# Patient Record
Sex: Female | Born: 1995 | Race: Black or African American | Hispanic: No | Marital: Single | Smoking: Current every day smoker
Health system: Southern US, Community
[De-identification: ages and names within clinical notes are randomized; demographics above are authoritative.]

## PROBLEM LIST (undated history)

## (undated) ENCOUNTER — Inpatient Hospital Stay (HOSPITAL_COMMUNITY): Payer: Self-pay

## (undated) ENCOUNTER — Emergency Department (HOSPITAL_COMMUNITY): Admission: EM | Payer: Medicaid Other

## (undated) DIAGNOSIS — Z202 Contact with and (suspected) exposure to infections with a predominantly sexual mode of transmission: Secondary | ICD-10-CM

## (undated) DIAGNOSIS — F32A Depression, unspecified: Secondary | ICD-10-CM

## (undated) DIAGNOSIS — Z789 Other specified health status: Secondary | ICD-10-CM

## (undated) DIAGNOSIS — G4730 Sleep apnea, unspecified: Secondary | ICD-10-CM

## (undated) DIAGNOSIS — F419 Anxiety disorder, unspecified: Secondary | ICD-10-CM

## (undated) HISTORY — DX: Anxiety disorder, unspecified: F41.9

## (undated) HISTORY — DX: Depression, unspecified: F32.A

## (undated) HISTORY — DX: Sleep apnea, unspecified: G47.30

## (undated) HISTORY — PX: NO PAST SURGERIES: SHX2092

---
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
## 2015-06-24 ENCOUNTER — Emergency Department (HOSPITAL_COMMUNITY): Payer: No Typology Code available for payment source

## 2015-06-24 ENCOUNTER — Emergency Department (HOSPITAL_COMMUNITY)
Admission: EM | Admit: 2015-06-24 | Discharge: 2015-06-24 | Disposition: A | Discharge status: Home / Self Care | Payer: No Typology Code available for payment source | Attending: Emergency Medicine | Admitting: Emergency Medicine

## 2015-06-24 ENCOUNTER — Encounter (HOSPITAL_COMMUNITY): Payer: Self-pay | Admitting: Emergency Medicine

## 2015-06-24 DIAGNOSIS — Y998 Other external cause status: Secondary | ICD-10-CM | POA: Diagnosis not present

## 2015-06-24 DIAGNOSIS — S161XXA Strain of muscle, fascia and tendon at neck level, initial encounter: Secondary | ICD-10-CM

## 2015-06-24 DIAGNOSIS — S29002A Unspecified injury of muscle and tendon of back wall of thorax, initial encounter: Secondary | ICD-10-CM | POA: Diagnosis not present

## 2015-06-24 DIAGNOSIS — S199XXA Unspecified injury of neck, initial encounter: Secondary | ICD-10-CM | POA: Diagnosis present

## 2015-06-24 DIAGNOSIS — Z79899 Other long term (current) drug therapy: Secondary | ICD-10-CM | POA: Insufficient documentation

## 2015-06-24 DIAGNOSIS — Y9389 Activity, other specified: Secondary | ICD-10-CM | POA: Insufficient documentation

## 2015-06-24 DIAGNOSIS — Y92410 Unspecified street and highway as the place of occurrence of the external cause: Secondary | ICD-10-CM | POA: Diagnosis not present

## 2015-06-24 MED ORDER — IBUPROFEN 800 MG PO TABS: 800.0000 mg | ORAL_TABLET | Freq: Three times a day (TID) | ORAL | Status: DC | PRN

## 2015-06-24 MED ORDER — TRAMADOL HCL 50 MG PO TABS: 50.0000 mg | ORAL_TABLET | Freq: Four times a day (QID) | ORAL | Status: DC | PRN
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
## 2015-06-24 NOTE — Discharge Instructions (Signed)
Return here as needed. Follow up with a PCP.

## 2015-07-24 ENCOUNTER — Inpatient Hospital Stay (HOSPITAL_COMMUNITY)
Admission: AD | Admit: 2015-07-24 | Discharge: 2015-07-25 | Disposition: A | Discharge status: Home / Self Care | Payer: Medicaid Other | Source: Ambulatory Visit | Attending: Obstetrics & Gynecology | Admitting: Obstetrics & Gynecology

## 2015-07-24 DIAGNOSIS — B9689 Other specified bacterial agents as the cause of diseases classified elsewhere: Secondary | ICD-10-CM | POA: Insufficient documentation

## 2015-07-24 DIAGNOSIS — N760 Acute vaginitis: Secondary | ICD-10-CM

## 2015-07-24 DIAGNOSIS — O23591 Infection of other part of genital tract in pregnancy, first trimester: Secondary | ICD-10-CM | POA: Insufficient documentation

## 2015-07-24 DIAGNOSIS — Z3A01 Less than 8 weeks gestation of pregnancy: Secondary | ICD-10-CM | POA: Insufficient documentation

## 2015-07-24 DIAGNOSIS — O26899 Other specified pregnancy related conditions, unspecified trimester: Secondary | ICD-10-CM

## 2015-07-24 DIAGNOSIS — R109 Unspecified abdominal pain: Secondary | ICD-10-CM

## 2015-07-25 ENCOUNTER — Encounter (HOSPITAL_COMMUNITY): Payer: Self-pay

## 2015-07-25 ENCOUNTER — Inpatient Hospital Stay (HOSPITAL_COMMUNITY): Payer: Medicaid Other

## 2015-07-25 DIAGNOSIS — B9689 Other specified bacterial agents as the cause of diseases classified elsewhere: Secondary | ICD-10-CM | POA: Diagnosis not present

## 2015-07-25 DIAGNOSIS — N760 Acute vaginitis: Secondary | ICD-10-CM

## 2015-07-25 DIAGNOSIS — R109 Unspecified abdominal pain: Secondary | ICD-10-CM | POA: Diagnosis present

## 2015-07-25 DIAGNOSIS — A499 Bacterial infection, unspecified: Secondary | ICD-10-CM | POA: Diagnosis not present

## 2015-07-25 DIAGNOSIS — O9989 Other specified diseases and conditions complicating pregnancy, childbirth and the puerperium: Secondary | ICD-10-CM

## 2015-07-25 DIAGNOSIS — O23591 Infection of other part of genital tract in pregnancy, first trimester: Secondary | ICD-10-CM

## 2015-07-25 DIAGNOSIS — Z3A01 Less than 8 weeks gestation of pregnancy: Secondary | ICD-10-CM | POA: Diagnosis not present

## 2015-07-25 LAB — WET PREP, GENITAL
Sperm: NONE SEEN
TRICH WET PREP: NONE SEEN
YEAST WET PREP: NONE SEEN

## 2015-07-25 LAB — CBC
HCT: 36 % (ref 36.0–46.0)
Hemoglobin: 12.4 g/dL (ref 12.0–15.0)
MCH: 30.5 pg (ref 26.0–34.0)
MCHC: 34.4 g/dL (ref 30.0–36.0)
MCV: 88.7 fL (ref 78.0–100.0)
PLATELETS: 275 10*3/uL (ref 150–400)
RBC: 4.06 MIL/uL (ref 3.87–5.11)
RDW: 13.2 % (ref 11.5–15.5)
WBC: 9.2 10*3/uL (ref 4.0–10.5)

## 2015-07-25 LAB — URINALYSIS, ROUTINE W REFLEX MICROSCOPIC
Bilirubin Urine: NEGATIVE
Glucose, UA: NEGATIVE mg/dL
HGB URINE DIPSTICK: NEGATIVE
Ketones, ur: NEGATIVE mg/dL
Leukocytes, UA: NEGATIVE
NITRITE: NEGATIVE
Protein, ur: NEGATIVE mg/dL
SPECIFIC GRAVITY, URINE: 1.025 (ref 1.005–1.030)
pH: 6 (ref 5.0–8.0)

## 2015-07-25 LAB — GC/CHLAMYDIA PROBE AMP (~~LOC~~) NOT AT ARMC
CHLAMYDIA, DNA PROBE: NEGATIVE
Neisseria Gonorrhea: NEGATIVE

## 2015-07-25 LAB — HIV ANTIBODY (ROUTINE TESTING W REFLEX): HIV SCREEN 4TH GENERATION: NONREACTIVE

## 2015-07-25 LAB — HCG, QUANTITATIVE, PREGNANCY: HCG, BETA CHAIN, QUANT, S: 15629 m[IU]/mL — AB (ref <5)

## 2015-07-25 LAB — RPR: RPR Ser Ql: NONREACTIVE

## 2015-07-25 LAB — POCT PREGNANCY, URINE: PREG TEST UR: POSITIVE — AB

## 2015-07-25 MED ORDER — METRONIDAZOLE 500 MG PO TABS: 500.0000 mg | ORAL_TABLET | Freq: Two times a day (BID) | ORAL | Status: DC

## 2015-07-25 NOTE — MAU Note (Signed)
Pt c/o lower abdominal cramping that started 2 weeks ago-on and off. Rates 5/10. Has not taken anything for the pain. Had 2 +upt at home. Denies vag bleeding or discharge. LMP: 06/11/2015.

## 2015-07-25 NOTE — MAU Provider Note (Signed)
History   [id number]   Chief Complaint  Patient presents with  . Possible Pregnancy  . Abdominal Cramping    HPI Kristine Erickson is a 20 y.o. female  G2P1001 at [redacted]w[redacted]d by LMP here with report of abdominal cramping that started two wedks ago.  Pain is intermittent in nature and not unilateral.  Pain is described as crampy.  Denies vaginal bleeding or abnormal vaginal discharge.    Patient's last menstrual period was 06/11/2015.  OB History  Gravida Para Term Preterm AB SAB TAB Ectopic Multiple Living  0 1    # Outcome Date GA Lbr Len/2nd Weight Sex Delivery Anes PTL Lv  2 Current           1 Term 04/16/14 [redacted]w[redacted]d 17:04 / 00:34 2.79 kg (6 lb 2.4 oz) F Vag-Spont EPI  Y      Past Medical History  Diagnosis Date  . Medical history non-contributory     No family history on file.  Social History   Social History  . Marital Status: Single    Spouse Name: N/A  . Number of Children: N/A  . Years of Education: N/A   Social History Main Topics  . Smoking status: Never Smoker   . Smokeless tobacco: Never Used  . Alcohol Use: No  . Drug Use: No  . Sexual Activity: Not Currently   Other Topics Concern  . None   Social History Narrative    No Known Allergies  No current facility-administered medications on file prior to encounter.   Current Outpatient Prescriptions on File Prior to Encounter  Medication Sig Dispense Refill  . ibuprofen (ADVIL,MOTRIN) 800 MG tablet Take 1 tablet (800 mg total) by mouth every 8 (eight) hours as needed. 21 tablet 0  . Prenatal Vit-Fe Fumarate-FA (PRENATAL MULTIVITAMIN) TABS tablet Take 1 tablet by mouth daily.    . traMADol (ULTRAM) 50 MG tablet Take 1 tablet (50 mg total) by mouth every 6 (six) hours as needed. 15 tablet 0     Review of Systems  Constitutional: Negative for fever and chills.  Gastrointestinal: Negative for nausea and vomiting.  Genitourinary: Positive for frequency and pelvic pain. Negative for dysuria,  vaginal bleeding and vaginal discharge.  All other systems reviewed and are negative.    Physical Exam   Filed Vitals:   07/25/15 0006  BP: 134/69  Pulse: 93  Temp: 98.3 F (36.8 C)  TempSrc: Oral  Resp: 18  Height:  (1.6 m)  Weight: 81.194 kg (179 lb)  SpO2: 100%    Physical Exam  Constitutional: She is oriented to person, place, and time. She appears well-developed and well-nourished. No distress.  HENT:  Head: Normocephalic.  Neck: Normal range of motion. Neck supple.  Cardiovascular: Normal rate, regular rhythm and normal heart sounds.   Respiratory: Effort normal and breath sounds normal. No respiratory distress.  GI: Soft. There is no tenderness.  Genitourinary: Right adnexum displays no mass, no tenderness and no fullness. Left adnexum displays no mass, no tenderness and no fullness. No bleeding in the vagina.  Musculoskeletal: Normal range of motion.  Neurological: She is alert and oriented to person, place, and time.  Skin: Skin is warm and dry.    MAU Course  Procedures  MDM Results for orders placed or performed during the hospital encounter of 07/24/15 (from the past 24 hour(s))  Urinalysis, Routine w reflex microscopic (not at [facility])     Status: None  Collection Time: 07/25/15 12:08 AM  Result Value Ref Range   Color, Urine YELLOW YELLOW   APPearance CLEAR CLEAR   Specific Gravity, Urine 1.025 1.005 - 1.030   pH 6.0 5.0 - 8.0   Glucose, UA NEGATIVE NEGATIVE mg/dL   Hgb urine dipstick NEGATIVE NEGATIVE   Bilirubin Urine NEGATIVE NEGATIVE   Ketones, ur NEGATIVE NEGATIVE mg/dL   Protein, ur NEGATIVE NEGATIVE mg/dL   Nitrite NEGATIVE NEGATIVE   Leukocytes, UA NEGATIVE NEGATIVE  Pregnancy, urine POC     Status: Abnormal   Collection Time: 07/25/15 12:31 AM  Result Value Ref Range   Preg Test, Ur POSITIVE (A) NEGATIVE  hCG, quantitative, pregnancy     Status: Abnormal   Collection Time: 07/25/15  1:06 AM  Result Value Ref Range   hCG, Beta  Chain, Quant, S 15629 (H) <5 mIU/mL  CBC     Status: None   Collection Time: 07/25/15  1:06 AM  Result Value Ref Range   WBC 9.2 4.0 - 10.5 K/uL   RBC 4.06 3.87 - 5.11 MIL/uL   Hemoglobin 12.4 12.0 - 15.0 g/dL   HCT 16.136.0 09.636.0 - 04.546.0 %   MCV 88.7 78.0 - 100.0 fL   MCH 30.5 26.0 - 34.0 pg   MCHC 34.4 30.0 - 36.0 g/dL   RDW 40.913.2 81.111.5 - 91.415.5 %   Platelets 275 150 - 400 K/uL  Wet prep, genital     Status: Abnormal   Collection Time: 07/25/15  1:15 AM  Result Value Ref Range   Yeast Wet Prep HPF POC NONE SEEN NONE SEEN   Trich, Wet Prep NONE SEEN NONE SEEN   Clue Cells Wet Prep HPF POC PRESENT (A) NONE SEEN   WBC, Wet Prep HPF POC MANY (A) NONE SEEN   Sperm NONE SEEN    Ultrasound: FINDINGS: Intrauterine gestational sac: Single  Yolk sac: Yes  Embryo: Yes  Cardiac Activity: Yes  Heart Rate: 106 bpm  MSD: mm w d  CRL: 2.3 mm 5 w 5 d US EDC: 03/21/2016  Subchorionic hemorrhage: None visualized.  Maternal uterus/adnexae: Normal ovaries. No abnormal pelvic fluid collections.  IMPRESSION: Single living intrauterine gestation measuring 5 weeks 5 days by crown-rump length  Assessment and Plan  20 y.o. G2P1001 at 468w2d IUP  Bacterial Vaginosis  Plan: Discharge home Begin prenatal care RX Flagyl Early Pregnancy precautions  Marlis EdelsonWalidah N Karim, CNM 07/25/2015 2:53 AM

## 2015-07-25 NOTE — Discharge Instructions (Signed)

## 2015-09-29 ENCOUNTER — Ambulatory Visit (INDEPENDENT_AMBULATORY_CARE_PROVIDER_SITE_OTHER): Payer: Medicaid Other | Admitting: General Practice

## 2015-09-29 VITALS — BP 130/88 | HR 92 | Ht 63.0 in | Wt 172.0 lb

## 2015-09-29 DIAGNOSIS — O360120 Maternal care for anti-D [Rh] antibodies, second trimester, not applicable or unspecified: Secondary | ICD-10-CM | POA: Diagnosis present

## 2015-09-29 DIAGNOSIS — O360190 Maternal care for anti-D [Rh] antibodies, unspecified trimester, not applicable or unspecified: Secondary | ICD-10-CM

## 2015-09-29 MED ORDER — RHO D IMMUNE GLOBULIN 1500 UNIT/2ML IJ SOSY
300.0000 ug | PREFILLED_SYRINGE | Freq: Once | INTRAMUSCULAR | Status: AC
  Administered 2015-09-29: 300 ug via INTRAMUSCULAR

## 2015-09-29 NOTE — Progress Notes (Signed)
Patient here for rhogam today. Patient had TAB on 7/10 in [place]. Patient was told after she left she needed a rhogam shot asap and was told it would cost her another $100. Patient here today for injection.

## 2016-03-16 ENCOUNTER — Encounter (HOSPITAL_COMMUNITY): Payer: Self-pay | Admitting: Emergency Medicine

## 2016-03-16 ENCOUNTER — Emergency Department (HOSPITAL_COMMUNITY)
Admission: EM | Admit: 2016-03-16 | Discharge: 2016-03-16 | Disposition: A | Discharge status: Home / Self Care | Payer: Medicaid Other | Attending: Emergency Medicine | Admitting: Emergency Medicine

## 2016-03-16 DIAGNOSIS — Z79899 Other long term (current) drug therapy: Secondary | ICD-10-CM | POA: Insufficient documentation

## 2016-03-16 DIAGNOSIS — H1033 Unspecified acute conjunctivitis, bilateral: Secondary | ICD-10-CM

## 2016-03-16 MED ORDER — ERYTHROMYCIN 5 MG/GM OP OINT: TOPICAL_OINTMENT | OPHTHALMIC | 0 refills | Status: DC

## 2016-03-16 NOTE — Discharge Instructions (Signed)
Use ointment 4 times daily on both eyes for the next week Wash hands frequently since this is very contagious

## 2016-03-16 NOTE — ED Triage Notes (Signed)
Pt complaint of right eye redness, itching, and burning onset 2 days ago. Pt continues to reports "spreading to other eye too."

## 2016-03-16 NOTE — ED Provider Notes (Signed)
[facility]-EMERGENCY DEPT Provider Note   CSN: [id number] Arrival date & time: 03/16/16  1128   By signing my name below, I, Kristine Erickson, attest that this documentation has been prepared under the direction and in the presence of Terance HartKelly Gekas, PA-C Electronically Signed: Valentino SaxonBianca Erickson, ED Scribe. 03/16/16. 12:04 PM.  History   Chief Complaint Chief Complaint  Patient presents with  . Conjunctivitis   The history is provided by the patient. No language interpreter was used.   HPI Comments: Kristine Erickson is a 20 y.o. female who presents to the Emergency Department complaining of moderate, constant, right eye redness onset two days ago. She denies recent trauma or injury. Pt reports associated itching and burning sensation and notes it is spreading to her left eye. She notes her "right eyeball is tight and painful". Pt states her vision got blurry last night followed by photophobia. She reports swelling around her eyes when she woke up this morning. No alleviating factors noted. Denies HA, fever, cough, rhinorrhea, congestion, CP. Pt states she denies prescption eye wear or contacts. She also denies recent sick contact.    Past Medical History:  Diagnosis Date  . Medical history non-contributory     Patient Active Problem List   Diagnosis Date Noted  . Normal labor 04/16/2014  . Status post normal vaginal delivery 04/16/2014  . [redacted] weeks gestation of pregnancy   . Vaginal bleeding in pregnancy     Past Surgical History:  Procedure Laterality Date  . NO PAST SURGERIES      OB History    Gravida Para Term Preterm AB Living   2 1 1     1    SAB TAB Ectopic Multiple Live Births         0 1       Home Medications    Prior to Admission medications   Medication Sig Start Date End Date Taking? Authorizing Provider  metroNIDAZOLE (FLAGYL) 500 MG tablet Take 1 tablet (500 mg total) by mouth 2 (two) times daily. 07/25/15   Marlis EdelsonWalidah N Karim, CNM  Prenatal Vit-Fe Fumarate-FA  (PRENATAL MULTIVITAMIN) TABS tablet Take 1 tablet by mouth daily.    Historical Provider, MD    Family History No family history on file.  Social History Social History  Substance Use Topics  . Smoking status: Never Smoker  . Smokeless tobacco: Never Used  . Alcohol use No     Allergies   Patient has no known allergies.   Review of Systems Review of Systems  Constitutional: Negative for fever.  HENT: Negative for congestion and rhinorrhea.   Eyes: Positive for photophobia, pain, redness, itching and visual disturbance.  Respiratory: Negative for cough.   Cardiovascular: Negative for chest pain.  Neurological: Negative for headaches.     Physical Exam Updated Vital Signs BP 124/77 (BP Location: Right Arm)   Pulse 98   Temp 98.6 F (37 C) (Oral)   Resp 16   LMP 02/27/2016   SpO2 100%   Breastfeeding? Unknown   Physical Exam  Constitutional: She is oriented to person, place, and time. She appears well-developed and well-nourished. No distress.  HENT:  Head: Normocephalic and atraumatic.  Eyes: Lids are normal. Pupils are equal, round, and reactive to light. Lids are everted and swept, no foreign bodies found. Right eye exhibits no chemosis, no discharge, no exudate and no hordeolum. No foreign body present in the right eye. Left eye exhibits no chemosis, no discharge, no exudate and no hordeolum. No foreign  body present in the left eye. Right conjunctiva is injected. Right conjunctiva has no hemorrhage. Left conjunctiva is not injected. Left conjunctiva has no hemorrhage. No scleral icterus. Right eye exhibits normal extraocular motion. Left eye exhibits normal extraocular motion.  Fundoscopic exam:      The right eye shows no AV nicking, no exudate, no hemorrhage and no papilledema.       The left eye shows no AV nicking, no exudate, no hemorrhage and no papilledema.  Neck: Normal range of motion.  Cardiovascular: Normal rate.   Pulmonary/Chest: Effort normal. No  respiratory distress.  Abdominal: She exhibits no distension.  Neurological: She is alert and oriented to person, place, and time.  Skin: Skin is warm and dry.  Psychiatric: She has a normal mood and affect. Her behavior is normal.  Nursing note and vitals reviewed.    ED Treatments / Results   DIAGNOSTIC STUDIES: Oxygen Saturation is 100% on RA, normal by my interpretation.    COORDINATION OF CARE: 12:01 PM Discussed treatment plan with pt at bedside which includes prescribed ointment and f/u with ophthamologist if symptoms worsen and pt agreed to plan.   Labs (all labs ordered are listed, but only abnormal results are displayed) Labs Reviewed - No data to display  EKG  EKG Interpretation None       Radiology No results found.  Procedures Procedures (including critical care time)  Medications Ordered in ED Medications - No data to display   Initial Impression / Assessment and Plan / ED Course  I have reviewed the triage vital signs and the nursing notes.  Pertinent labs & imaging results that were available during my care of the patient were reviewed by me and considered in my medical decision making (see chart for details).  Clinical Course    Patient presentation consistent with conjunctivitis. Visual acuity is 20/30 in L and 20/25 in R. She is non-toxic appearing and NAD. Presentation not concerning for iritis, or corneal abrasions.  Pt discharged with erythromycin ointment.  Personal hygiene and frequent handwashing discussed.  Patient advised to follow up with ophthalmologist if symptoms persist or worsen. Return precautions discussed.  Patient verbalizes understanding and is agreeable with discharge.  Final Clinical Impressions(s) / ED Diagnoses   Final diagnoses:  Acute conjunctivitis of both eyes, unspecified acute conjunctivitis type    New Prescriptions Discharge Medication List as of 03/16/2016 12:06 PM    START taking these medications   Details    erythromycin ophthalmic ointment Place a 1/2 inch ribbon of ointment into the lower eyelid four times daily for the next week, Print        I personally performed the services described in this documentation, which was scribed in my presence. The recorded information has been reviewed and is accurate.     Bethel BornKelly Marie Gekas, PA-C 03/16/16 1247    Tilden FossaElizabeth Rees, MD 03/20/16 Windell [id number]

## 2016-03-20 ENCOUNTER — Ambulatory Visit (HOSPITAL_COMMUNITY)
Admission: EM | Admit: 2016-03-20 | Discharge: 2016-03-20 | Disposition: A | Discharge status: Home / Self Care | Payer: Medicaid Other | Attending: Family Medicine | Admitting: Family Medicine

## 2016-03-20 ENCOUNTER — Encounter (HOSPITAL_COMMUNITY): Payer: Self-pay | Admitting: Emergency Medicine

## 2016-03-20 DIAGNOSIS — J00 Acute nasopharyngitis [common cold]: Secondary | ICD-10-CM

## 2016-03-20 DIAGNOSIS — B308 Other viral conjunctivitis: Secondary | ICD-10-CM

## 2016-03-20 MED ORDER — SULFACETAMIDE SODIUM 10 % OP SOLN: 1.0000 [drp] | OPHTHALMIC | 0 refills | Status: DC

## 2016-03-20 MED ORDER — IPRATROPIUM BROMIDE 0.06 % NA SOLN: 2.0000 | Freq: Four times a day (QID) | NASAL | 12 refills | Status: DC

## 2016-03-20 NOTE — ED Provider Notes (Signed)
CSN: [id number]     Arrival date & time 03/20/16  1859 History   First MD Initiated Contact with Patient 03/20/16 1937     Chief Complaint  Patient presents with  . Eye Problem   (Consider location/radiation/quality/duration/timing/severity/associated sxs/prior Treatment) Patient c/o right eye discharge and pink eye.  She was seen 12/29 and rx'd erythromycin opthalmic gel and she c/o it makes her vision blurry.  She states she has sinus congestion and cold sx's.   The history is provided by the patient.  Eye Problem  Location:  Right eye Quality:  Tearing Severity:  Moderate Onset quality:  Sudden Duration:  5 days Timing:  Constant Progression:  Improving Chronicity:  New Relieved by:  Nothing Worsened by:  Nothing Associated symptoms: discharge, itching and redness     Past Medical History:  Diagnosis Date  . Medical history non-contributory    Past Surgical History:  Procedure Laterality Date  . NO PAST SURGERIES     History reviewed. No pertinent family history. Social History  Substance Use Topics  . Smoking status: Never Smoker  . Smokeless tobacco: Never Used  . Alcohol use No   OB History    Gravida Para Term Preterm AB Living   2 1 1     1    SAB TAB Ectopic Multiple Live Births         0 1     Review of Systems  Constitutional: Negative.   HENT: Positive for rhinorrhea.   Eyes: Positive for discharge, redness and itching.  Respiratory: Negative.   Cardiovascular: Negative.   Gastrointestinal: Negative.   Endocrine: Negative.   Genitourinary: Negative.   Allergic/Immunologic: Negative.   Neurological: Negative.   Hematological: Negative.   Psychiatric/Behavioral: Negative.     Allergies  Patient has no known allergies.  Home Medications   Prior to Admission medications   Medication Sig Start Date End Date Taking? Authorizing Provider  erythromycin ophthalmic ointment Place a 1/2 inch ribbon of ointment into the lower eyelid four times daily  for the next week 03/16/16   Bethel BornKelly Marie Gekas, PA-C  ipratropium (ATROVENT) 0.06 % nasal spray Place 2 sprays into both nostrils 4 (four) times daily. 03/20/16   Deatra CanterWilliam J Zainab Crumrine, FNP  sulfacetamide (BLEPH-10) 10 % ophthalmic solution Place 1-2 drops into the right eye every 4 (four) hours. 03/20/16   Deatra CanterWilliam J Natasia Sanko, FNP   Meds Ordered and Administered this Visit  Medications - No data to display  BP 127/71 (BP Location: Left Arm)   Pulse 90   Temp 98.5 F (36.9 C) (Oral)   Resp 16   LMP 02/27/2016   SpO2 100%  No data found.   Physical Exam  Constitutional: She appears well-developed and well-nourished.  HENT:  Head: Normocephalic.  Right Ear: External ear normal.  Left Ear: External ear normal.  Mouth/Throat: Oropharynx is clear and moist.  Eyes: EOM are normal. Pupils are equal, round, and reactive to light. Right eye exhibits discharge.  Neck:  Right conjunctiva with erythema and edema   Cardiovascular: Normal rate, regular rhythm and normal heart sounds.   Pulmonary/Chest: Effort normal and breath sounds normal.  Nursing note and vitals reviewed.   Urgent Care Course   Clinical Course     Procedures (including critical care time)  Labs Review Labs Reviewed - No data to display  Imaging Review No results found.   Visual Acuity Review  Right Eye Distance:   Left Eye Distance:   Bilateral Distance:  Right Eye Near:   Left Eye Near:    Bilateral Near:         MDM   1. Chronic viral conjunctivitis of right eye   2. Acute nasopharyngitis    Bleph - 10 2 gtt's OD q 4 hours Ipratropium 0.06% 2 sprays per nostril qid #9ml      Deatra Canter, FNP 03/20/16 1950

## 2016-03-20 NOTE — ED Triage Notes (Signed)
[facility] a [facility] of right red eye itching and burning for 5 days.

## 2016-04-07 ENCOUNTER — Encounter (HOSPITAL_COMMUNITY): Payer: Self-pay | Admitting: *Deleted

## 2016-04-07 ENCOUNTER — Inpatient Hospital Stay (HOSPITAL_COMMUNITY)
Admission: AD | Admit: 2016-04-07 | Discharge: 2016-04-07 | Disposition: A | Discharge status: Home / Self Care | Payer: Medicaid Other | Source: Ambulatory Visit | Attending: Obstetrics and Gynecology | Admitting: Obstetrics and Gynecology

## 2016-04-07 DIAGNOSIS — Z3202 Encounter for pregnancy test, result negative: Secondary | ICD-10-CM | POA: Insufficient documentation

## 2016-04-07 DIAGNOSIS — R109 Unspecified abdominal pain: Secondary | ICD-10-CM | POA: Insufficient documentation

## 2016-04-07 DIAGNOSIS — F17210 Nicotine dependence, cigarettes, uncomplicated: Secondary | ICD-10-CM | POA: Insufficient documentation

## 2016-04-07 LAB — URINALYSIS, ROUTINE W REFLEX MICROSCOPIC
BILIRUBIN URINE: NEGATIVE
Glucose, UA: NEGATIVE mg/dL
HGB URINE DIPSTICK: NEGATIVE
Ketones, ur: NEGATIVE mg/dL
NITRITE: NEGATIVE
PH: 7 (ref 5.0–8.0)
Protein, ur: NEGATIVE mg/dL
SPECIFIC GRAVITY, URINE: 1.006 (ref 1.005–1.030)

## 2016-04-07 LAB — HCG, QUANTITATIVE, PREGNANCY: hCG, Beta Chain, Quant, S: <1 m[IU]/mL (ref <5)

## 2016-04-07 LAB — POCT PREGNANCY, URINE: Preg Test, Ur: NEGATIVE

## 2016-04-07 NOTE — MAU Provider Note (Signed)
History     CSN: [id number]  Arrival date and time: 04/07/16 1954   First Provider Initiated Contact with Patient 04/07/16 2132      Chief Complaint  Patient presents with  . Abdominal Pain   HPI   Ms.Kristine Erickson is a 21 y.o. female G2P1001 here in MAU for a pregnancy test. She had 2 positive tests at home. She has had occasional abdominal pain. She denies pain at this time.   OB History    Gravida Para Term Preterm AB Living   2 1 1     1    SAB TAB Ectopic Multiple Live Births         0 1      Past Medical History:  Diagnosis Date  . Medical history non-contributory     Past Surgical History:  Procedure Laterality Date  . NO PAST SURGERIES      History reviewed. No pertinent family history.  Social History  Substance Use Topics  . Smoking status: Current Every Day Smoker    Types: Cigarettes  . Smokeless tobacco: Never Used  . Alcohol use No    Allergies: No Known Allergies  Prescriptions Prior to Admission  Medication Sig Dispense Refill Last Dose  . erythromycin ophthalmic ointment Place a 1/2 inch ribbon of ointment into the lower eyelid four times daily for the next week 1 g 0   . ipratropium (ATROVENT) 0.06 % nasal spray Place 2 sprays into both nostrils 4 (four) times daily. 15 mL 12   . sulfacetamide (BLEPH-10) 10 % ophthalmic solution Place 1-2 drops into the right eye every 4 (four) hours. 10 mL 0    Recent Results (from the past 2160 hour(s))  Urinalysis, Routine w reflex microscopic     Status: Abnormal   Collection Time: 04/07/16  9:14 PM  Result Value Ref Range   Color, Urine STRAW (A) YELLOW   APPearance CLEAR CLEAR   Specific Gravity, Urine 1.006 1.005 - 1.030   pH 7.0 5.0 - 8.0   Glucose, UA NEGATIVE NEGATIVE mg/dL   Hgb urine dipstick NEGATIVE NEGATIVE   Bilirubin Urine NEGATIVE NEGATIVE   Ketones, ur NEGATIVE NEGATIVE mg/dL   Protein, ur NEGATIVE NEGATIVE mg/dL   Nitrite NEGATIVE NEGATIVE   Leukocytes, UA SMALL (A) NEGATIVE    RBC / HPF 0-5 0 - 5 RBC/hpf   WBC, UA 0-5 0 - 5 WBC/hpf   Bacteria, UA RARE (A) NONE SEEN   Squamous Epithelial / LPF 0-5 (A) NONE SEEN  Pregnancy, urine POC     Status: None   Collection Time: 04/07/16  9:22 PM  Result Value Ref Range   Preg Test, Ur NEGATIVE NEGATIVE    Comment:        THE SENSITIVITY OF THIS METHODOLOGY IS >24 mIU/mL   hCG, quantitative, pregnancy     Status: None   Collection Time: 04/07/16  9:34 PM  Result Value Ref Range   hCG, Beta Chain, Quant, S <1 <5 mIU/mL    Comment:          GEST. AGE      CONC.  (mIU/mL)   <=1 WEEK        5 - 50     2 WEEKS       50 - 500     3 WEEKS       100 - 10,000     4 WEEKS     1,000 - 30,000     5 WEEKS  3,500 - 115,000   6-8 WEEKS     12,000 - 270,000    12 WEEKS     15,000 - 220,000        FEMALE AND NON-PREGNANT FEMALE:     LESS THAN 5 mIU/mL REPEATED TO VERIFY    Review of Systems  Gastrointestinal: Negative for abdominal pain.  Genitourinary: Negative for vaginal bleeding.   Physical Exam   Blood pressure 131/81, pulse 81, temperature 98.4 F (36.9 C), temperature source Oral, resp. rate 16, last menstrual period 02/27/2016, SpO2 100 %, unknown if currently breastfeeding.  Physical Exam  Constitutional: She is oriented to person, place, and time. She appears well-developed and well-nourished. No distress.  HENT:  Head: Normocephalic.  Respiratory: Effort normal.  Musculoskeletal: Normal range of motion.  Neurological: She is alert and oriented to person, place, and time.  Skin: Skin is warm. She is not diaphoretic.  Psychiatric: Her behavior is normal.   MAU Course  Procedures  None  MDM  Urine pregnancy test negative Beta hcg level- negative.   Assessment and Plan   A:  1. Encounter for pregnancy test with result negative     P:  Discharge home in stable condition Return to MAU for emergencies only   Duane LopeJennifer I Frimet Durfee, NP 04/08/2016 1:42 AM

## 2016-04-07 NOTE — Progress Notes (Signed)
Pt came in for pregnancy test and abdominal pain with her 2913yr old. Pt told to let someone pick up child due to flu restriction. Pt states she does not have anyone. Pt said she will come back later. Pt left in good condition.

## 2016-04-07 NOTE — Progress Notes (Signed)
Pt back after dropping off daughter. HCG and UA ordered. Results pending

## 2016-04-07 NOTE — MAU Note (Signed)
Pt has small child with her. No children under 21yo allowed due to flu restrictions. Pt does not have anyone to come get her. Pt is going to take child to someone and return in about 

## 2016-04-07 NOTE — Discharge Instructions (Signed)
Human Chorionic Gonadotropin Test Human chorionic gonadotropin (hCG) is a hormone produced during pregnancy by the cells that form the placenta. The placenta is the organ that grows inside your womb (uterus) to nourish a developing baby. When you are pregnant, hCG starts to appear in your blood about 11 days after conception. It continues to go up for the first 8-11 weeks of pregnancy. Your hCG level can be measured with several different types of tests. You may have:  A urine test.  hCG is eliminated from your body by your kidneys, so a urine test is one way to check for this hormone.  A urine test only shows whether there is hCG in your urine. It does not measure how much.  You may have a urine test to find out whether you are pregnant.  A home pregnancy test detects whether there is hCG in your urine.  A qualitative blood test.  Like the urine test, this blood test only shows whether there is hCG in your blood. It does not measure how much.  You may have this type of blood test to find out whether you are pregnant.  A quantitative blood test.  This type of blood test measures the amount of hCG in your blood.  You may have this type of test to diagnose an abnormal pregnancy or determine whether you are at risk of, or have had, a failed pregnancy (miscarriage). How do I prepare for this test? For the urine test:  Limit your fluid intake before the urine test as directed by your health care provider.  Collect the sample the first time you urinate in the morning.  Let your health care provider know if you have blood in your urine. This may interfere with the test result. Some medicines may interfere with the urine and blood tests. Let your health care provider know about all the medicines you are taking. No additional preparation is required for the blood test. What do the results mean? It is your responsibility to obtain your test results. Ask the lab or department performing the  test when and how you will get your results. Talk to your health care provider if you have any questions about your test results. The results of the hCG urine test and the qualitative hCG blood test are either positive or negative. The results of the quantitative hCG blood test are reported as a number. hCG is measured in international units per liter (IU/L). Meaning of Negative Test Results  A negative result on a urine or qualitative blood test could mean that you are not pregnant. It could also mean the test was done too early to detect hCG. If you still have other signs of pregnancy, the test should be repeated. Meaning of Positive Test Results  A positive result on the urine or qualitative blood tests means you are most likely pregnant. Your health care provider may confirm your pregnancy with an imaging study of the inside of your uterus at 5-6 weeks (ultrasound). Range of Normal Values  Ranges for normal values for the quantitative hCG blood test may vary among different labs and hospitals. You should always check with your health care provider after having lab work or other tests done to discuss whether your values are considered within normal limits.  Less than 5 IU/L means it is most likely you are not pregnant.  Greater than 25 IU/L means it is most likely you are pregnant. Meaning of Results Outside Normal Value Ranges  If your  hCG level on the quantitative test is not what would be expected, you may have the test again. It may also be important for your health care provider to know whether your hCG level goes up or down over time. Common causes of results outside the normal range include: °· Being pregnant with twins (hCG level is higher than expected). °· Having an ectopic pregnancy (hCG rises more slowly than expected). °· Miscarriage (hCG level falls). °· Abnormal growths in the womb (hCG level is higher than expected). °Talk with your health care provider to discuss your results,  treatment options, and if necessary, the need for more tests. Talk with your health care provider if you have any questions about your results. °This information is not intended to replace advice given to you by your health care provider. Make sure you discuss any questions you have with your health care provider. °Document Released: 04/06/2004 Document Revised: 11/09/2015 Document Reviewed: 06/09/2013 °Elsevier Interactive Patient Education © 2017 [employer] ° °

## 2016-06-08 ENCOUNTER — Encounter (HOSPITAL_COMMUNITY): Payer: Self-pay | Admitting: *Deleted

## 2016-06-08 ENCOUNTER — Emergency Department (HOSPITAL_COMMUNITY)
Admission: EM | Admit: 2016-06-08 | Discharge: 2016-06-08 | Disposition: A | Discharge status: Home / Self Care | Payer: Self-pay | Attending: Emergency Medicine | Admitting: Emergency Medicine

## 2016-06-08 ENCOUNTER — Emergency Department (HOSPITAL_COMMUNITY)
Admission: EM | Admit: 2016-06-08 | Discharge: 2016-06-08 | Disposition: A | Discharge status: Home / Self Care | Payer: Medicaid Other | Attending: Emergency Medicine | Admitting: Emergency Medicine

## 2016-06-08 ENCOUNTER — Encounter (HOSPITAL_COMMUNITY): Payer: Self-pay | Admitting: Emergency Medicine

## 2016-06-08 DIAGNOSIS — B9689 Other specified bacterial agents as the cause of diseases classified elsewhere: Secondary | ICD-10-CM | POA: Insufficient documentation

## 2016-06-08 DIAGNOSIS — F17210 Nicotine dependence, cigarettes, uncomplicated: Secondary | ICD-10-CM | POA: Insufficient documentation

## 2016-06-08 DIAGNOSIS — N898 Other specified noninflammatory disorders of vagina: Secondary | ICD-10-CM | POA: Insufficient documentation

## 2016-06-08 DIAGNOSIS — N72 Inflammatory disease of cervix uteri: Secondary | ICD-10-CM

## 2016-06-08 DIAGNOSIS — N760 Acute vaginitis: Secondary | ICD-10-CM | POA: Insufficient documentation

## 2016-06-08 DIAGNOSIS — R102 Pelvic and perineal pain: Secondary | ICD-10-CM | POA: Insufficient documentation

## 2016-06-08 LAB — URINALYSIS, ROUTINE W REFLEX MICROSCOPIC
BILIRUBIN URINE: NEGATIVE
Bacteria, UA: NONE SEEN
GLUCOSE, UA: NEGATIVE mg/dL
HGB URINE DIPSTICK: NEGATIVE
Ketones, ur: NEGATIVE mg/dL
NITRITE: NEGATIVE
PH: 7 (ref 5.0–8.0)
Protein, ur: NEGATIVE mg/dL
SPECIFIC GRAVITY, URINE: 1.015 (ref 1.005–1.030)
Squamous Epithelial / LPF: NONE SEEN

## 2016-06-08 LAB — WET PREP, GENITAL
SPERM: NONE SEEN
Trich, Wet Prep: NONE SEEN
Yeast Wet Prep HPF POC: NONE SEEN

## 2016-06-08 LAB — POC URINE PREG, ED: PREG TEST UR: NEGATIVE

## 2016-06-08 MED ORDER — AZITHROMYCIN 250 MG PO TABS
1000.0000 mg | ORAL_TABLET | Freq: Once | ORAL | Status: AC
  Administered 2016-06-08: 1000 mg via ORAL
  Filled 2016-06-08: qty 4

## 2016-06-08 MED ORDER — METRONIDAZOLE 500 MG PO TABS: 500.0000 mg | ORAL_TABLET | Freq: Two times a day (BID) | ORAL | 0 refills | Status: DC

## 2016-06-08 MED ORDER — CEFTRIAXONE SODIUM 250 MG IJ SOLR
250.0000 mg | Freq: Once | INTRAMUSCULAR | Status: AC
  Administered 2016-06-08: 250 mg via INTRAMUSCULAR
  Filled 2016-06-08: qty 250

## 2016-06-08 NOTE — ED Triage Notes (Signed)
Pt states she has been having abd pain for three days. Denies urinary symptoms. Pt endorses vaginal discharge

## 2016-06-08 NOTE — ED Provider Notes (Signed)
[facility]ENCY DEPT Provider Note   CSN: [id number] Arrival date & time: 06/08/16  1228     History   Chief Complaint Chief Complaint  Patient presents with  . Vaginal Discharge  . Abdominal Pain    HPI Kristine Erickson is a 21 y.o. female.  The history is provided by the patient.  Vaginal Discharge   This is a new problem. Episode onset: 3 days. The problem occurs constantly. The problem has not changed since onset.The discharge occurs after urination. The discharge was yellow and watery. She is not pregnant. Associated symptoms include abdominal pain (suprapubic cramping) and nausea. Pertinent negatives include no fever, no diarrhea, no vomiting, no dysuria, no genital itching and no genital lesions. She has tried nothing for the symptoms. Her past medical history is significant for STD (chlamydia 2 yrs ago).   Admits to unprotected sex with one partner. Last encounter was approx 2 weeks ago.  LMP: 05/16/16  Past Medical History:  Diagnosis Date  . Medical history non-contributory     Patient Active Problem List   Diagnosis Date Noted  . Normal labor 04/16/2014  . Status post normal vaginal delivery 04/16/2014  . [redacted] weeks gestation of pregnancy   . Vaginal bleeding in pregnancy     Past Surgical History:  Procedure Laterality Date  . NO PAST SURGERIES      OB History    Gravida Para Term Preterm AB Living   2 1 1     1    SAB TAB Ectopic Multiple Live Births         0 1       Home Medications    Prior to Admission medications   Medication Sig Start Date End Date Taking? Authorizing Provider  erythromycin ophthalmic ointment Place a 1/2 inch ribbon of ointment into the lower eyelid four times daily for the next week 03/16/16   Bethel Born, PA-C  ipratropium (ATROVENT) 0.06 % nasal spray Place 2 sprays into both nostrils 4 (four) times daily. 03/20/16   Deatra Canter, FNP  sulfacetamide (BLEPH-10) 10 % ophthalmic solution Place 1-2 drops into the  right eye every 4 (four) hours. 03/20/16   Deatra Canter, FNP    Family History History reviewed. No pertinent family history.  Social History Social History  Substance Use Topics  . Smoking status: Current Every Day Smoker    Types: Cigarettes  . Smokeless tobacco: Never Used  . Alcohol use No     Allergies   Patient has no known allergies.   Review of Systems Review of Systems  Constitutional: Negative for fever.  Gastrointestinal: Positive for abdominal pain (suprapubic cramping) and nausea. Negative for diarrhea and vomiting.  Genitourinary: Positive for vaginal discharge. Negative for dysuria.  Ten systems are reviewed and are negative for acute change except as noted in the HPI    Physical Exam Updated Vital Signs BP 109/63 (BP Location: Right Arm)   Pulse 86   Temp 99.1 F (37.3 C) (Oral)   Resp 17   LMP 05/16/2016   SpO2 100%   Physical Exam  Constitutional: She is oriented to person, place, and time. She appears well-developed and well-nourished. No distress.  HENT:  Head: Normocephalic and atraumatic.  Nose: Nose normal.  Eyes: Conjunctivae and EOM are normal. Pupils are equal, round, and reactive to light. Right eye exhibits no discharge. Left eye exhibits no discharge. No scleral icterus.  Neck: Normal range of motion. Neck supple.  Cardiovascular: Normal rate and  regular rhythm.  Exam reveals no gallop and no friction rub.   No murmur heard. Pulmonary/Chest: Effort normal and breath sounds normal. No stridor. No respiratory distress. She has no rales.  Abdominal: Soft. She exhibits no distension. There is no hepatosplenomegaly. There is tenderness in the suprapubic area. There is no rigidity, no rebound, no guarding and no tenderness at McBurney's point.  Genitourinary:  Genitourinary Comments:    Musculoskeletal: She exhibits no edema or tenderness.  Neurological: She is alert and oriented to person, place, and time.  Skin: Skin is warm and dry.  No rash noted. She is not diaphoretic. No erythema.  Psychiatric: She has a normal mood and affect.  Vitals reviewed.    ED Treatments / Results  Labs (all labs ordered are listed, but only abnormal results are displayed) Labs Reviewed  URINALYSIS, ROUTINE W REFLEX MICROSCOPIC - Abnormal; Notable for the following:       Result Value   Leukocytes, UA SMALL (*)    All other components within normal limits  WET PREP, GENITAL  POC URINE PREG, ED  GC/CHLAMYDIA PROBE AMP ([place]) NOT AT [facility]    EKG  EKG Interpretation None       Radiology No results found.  Procedures Procedures (including critical care time)  Medications Ordered in ED Medications - No data to display   Initial Impression / Assessment and Plan / ED Course  I have reviewed the triage vital signs and the nursing notes.  Pertinent labs & imaging results that were available during my care of the patient were reviewed by me and considered in my medical decision making (see chart for details).     Unable to perform the pelvic exam at this time as patient had to leave to pick up her daughter. Reported that she will return following that in order to continue her care. UA and urine pregnancy negative. Please refer to subsequent note for further details.  The patient is safe for discharge with strict return precautions.   Final Clinical Impressions(s) / ED Diagnoses   Final diagnoses:  Vaginal discharge  Pelvic pain   Disposition: Discharge  Condition: Good  I have discussed the results, Dx and Tx plan with the patient who expressed understanding and agree(s) with the plan. Discharge instructions discussed at great length. The patient was given strict return precautions who verbalized understanding of the instructions. No further questions at time of discharge.    Discharge Medication List as of 06/08/2016  5:31 PM        Nira ConnPedro Eduardo Cardama, MD 06/08/16 [phone]

## 2016-06-08 NOTE — ED Triage Notes (Signed)
Pt reports lower abd cramping with vaginal discharge. Denies bleeding, denies urinary symptoms.

## 2016-06-08 NOTE — ED Provider Notes (Signed)
[facility] Provider Note   CSN: [id number] Arrival date & time: 06/08/16  1832   By signing my name below, I, Nelwyn Salisbury, attest that this documentation has been prepared under the direction and in the presence of non-physician practitioner, Kerrie Buffalo, NP. Electronically Signed: Nelwyn Salisbury, Scribe. 06/08/2016. 7:39 PM.   History   Chief Complaint Chief Complaint  Patient presents with  . Abdominal Pain   The history is provided by the patient. No language interpreter was used.  Abdominal Pain   This is a new problem. The current episode started more than 2 days ago. The problem occurs constantly. The problem has not changed since onset.The pain is located in the suprapubic region. Associated symptoms include frequency. Pertinent negatives include fever, diarrhea, nausea, vomiting, constipation, dysuria and headaches. The symptoms are aggravated by palpation. Nothing relieves the symptoms.    HPI Comments:  Kristine Erickson is an otherwise healthy 21 y.o. female who presents to the Emergency Department complaining of constant, mild, suprapubic abdominal pain onset 3 days ago. She reports associated vaginal discharge, frequency and chills. Pt describes her vaginal discharge as light yellow, non-odorous, and watery. She denies any new sex partners and is not suspicious of any potential STI infection. Her pain is worsened with palpation. She denies any vaginal bleeding, dysuria or difficulty urinating.   Past Medical History:  Diagnosis Date  . Medical history non-contributory     Patient Active Problem List   Diagnosis Date Noted  . Normal labor 04/16/2014  . Status post normal vaginal delivery 04/16/2014  . [redacted] weeks gestation of pregnancy   . Vaginal bleeding in pregnancy     Past Surgical History:  Procedure Laterality Date  . NO PAST SURGERIES      OB History    Gravida Para Term Preterm AB Living   2 1 1     1    SAB TAB Ectopic Multiple Live Births     0 1       Home Medications    Prior to Admission medications   Medication Sig Start Date End Date Taking? Authorizing Provider  erythromycin ophthalmic ointment Place a 1/2 inch ribbon of ointment into the lower eyelid four times daily for the next week 03/16/16   Bethel Born, PA-C  ipratropium (ATROVENT) 0.06 % nasal spray Place 2 sprays into both nostrils 4 (four) times daily. 03/20/16   Deatra Canter, FNP  metroNIDAZOLE (FLAGYL) 500 MG tablet Take 1 tablet (500 mg total) by mouth 2 (two) times daily. 06/08/16   Hope Orlene Och, NP  sulfacetamide (BLEPH-10) 10 % ophthalmic solution Place 1-2 drops into the right eye every 4 (four) hours. 03/20/16   Deatra Canter, FNP    Family History History reviewed. No pertinent family history.  Social History Social History  Substance Use Topics  . Smoking status: Current Every Day Smoker    Types: Cigarettes  . Smokeless tobacco: Never Used  . Alcohol use No     Allergies   Patient has no known allergies.   Review of Systems Review of Systems  Constitutional: Negative for chills and fever.  Cardiovascular: Negative for chest pain.  Gastrointestinal: Positive for abdominal pain. Negative for constipation, diarrhea, nausea and vomiting.  Genitourinary: Positive for frequency and vaginal discharge. Negative for difficulty urinating, dysuria and vaginal bleeding.  Musculoskeletal: Negative for back pain.  Skin: Negative for rash.  Allergic/Immunologic: Negative for immunocompromised state.  Neurological: Negative for headaches.  Psychiatric/Behavioral: The patient is not  nervous/anxious.      Physical Exam Updated Vital Signs BP 132/66 (BP Location: Left Arm)   Pulse 89   Temp 98.1 F (36.7 C) (Oral)   Resp 18   LMP 05/16/2016   SpO2 99%   Physical Exam  Constitutional: She is oriented to person, place, and time. She appears well-developed and well-nourished. No distress.  HENT:  Head: Normocephalic and atraumatic.    Eyes: Conjunctivae are normal.  Cardiovascular: Normal rate, regular rhythm and normal heart sounds.   Pulmonary/Chest: Effort normal.  Abdominal: Soft. She exhibits no distension. There is tenderness.  Lower abdominal tenderness with palpation  Genitourinary:  Genitourinary Comments: External genitalia without lesions, yellow frothy d/c vaginal vault, cervix inflamed, positive CMT, left adnexal tenderness that is mild. Uterus not enlarged.   Neurological: She is alert and oriented to person, place, and time.  Skin: Skin is warm and dry.  Psychiatric: She has a normal mood and affect.  Nursing note and vitals reviewed.    ED Treatments / Results  DIAGNOSTIC STUDIES:  Oxygen Saturation is 100% on RA, normal by my interpretation.    COORDINATION OF CARE:  8:03 PM Discussed treatment plan with pt at bedside which includes a pelvic exam and pt agreed to plan.  Labs (all labs ordered are listed, but only abnormal results are displayed) Labs Reviewed  HIV ANTIBODY (ROUTINE TESTING)  RPR  GC/CHLAMYDIA PROBE AMP ([place]  Radiology No results found.  Procedures Procedures (including critical care time)  Medications Ordered in ED Medications  cefTRIAXone (ROCEPHIN) injection 250 mg (250 mg Intramuscular Given 06/08/16 2203)  azithromycin (ZITHROMAX) tablet 1,000 mg (1,000 mg Oral Given 06/08/16 2203)     Initial Impression / Assessment and Plan / ED Course  I have reviewed the triage vital signs and the nursing notes.  Pertinent lab results that were available during my care of the patient were reviewed by me and considered in my medical decision making (see chart for details).  Patient is nontoxic, nonseptic appearing, in no apparent distress.  Patient's pain and other symptoms adequately managed in emergency department.  Labs and vitals reviewed.  Patient does not meet the SIRS or Sepsis criteria.  On repeat exam patient does not have a surgical abdomin and  there are no peritoneal signs.  No indication of appendicitis, bowel obstruction, bowel perforation, cholecystitis, diverticulitis, PID or ectopic pregnancy.  Patient discharged home with after treated with Rocephin and Zithromax for Cervicitis and given strict instructions for follow-up with their primary care physician. Rx for Flagyl for BV. I have also discussed reasons to return immediately to the ER.  Patient expresses understanding and agrees with plan.    Final Clinical Impressions(s) / ED Diagnoses   Final diagnoses:  Cervicitis  Bacterial vaginitis    New Prescriptions Discharge Medication List as of 06/08/2016 10:17 PM    START taking these medications   Details  metroNIDAZOLE (FLAGYL) 500 MG tablet Take 1 tablet (500 mg total) by mouth 2 (two) times daily., Starting Fri 06/08/2016, Print      I personally performed the services described in this documentation, which was scribed in my presence. The recorded information has been reviewed and is accurate.     [id number]Hope MorrowM Neese, NP 06/09/16 0131    Jerelyn ScottMartha Linker, MD 06/09/16 [phone]

## 2016-06-08 NOTE — ED Notes (Signed)
Pelvic cart was set up for patient.  Patient then informed staff that she needs to leave and would like her discharge papers.  Waiting for MD to return and will inform him.

## 2016-06-08 NOTE — ED Notes (Signed)
Patient spoke with EDP and decided to leave, but will be back.

## 2016-06-08 NOTE — ED Notes (Signed)
Pt stable, understands discharge instructions, and reasons for return.   

## 2016-06-10 LAB — HIV ANTIBODY (ROUTINE TESTING W REFLEX): HIV SCREEN 4TH GENERATION: NONREACTIVE

## 2016-06-11 LAB — RPR: RPR: NONREACTIVE

## 2016-06-11 LAB — GC/CHLAMYDIA PROBE AMP (~~LOC~~) NOT AT ARMC
CHLAMYDIA, DNA PROBE: NEGATIVE
NEISSERIA GONORRHEA: POSITIVE — AB

## 2016-07-21 ENCOUNTER — Encounter (HOSPITAL_COMMUNITY): Payer: Self-pay

## 2016-07-21 ENCOUNTER — Inpatient Hospital Stay (HOSPITAL_COMMUNITY)
Admission: AD | Admit: 2016-07-21 | Discharge: 2016-07-21 | Disposition: A | Discharge status: Home / Self Care | Payer: Self-pay | Source: Ambulatory Visit | Attending: Obstetrics & Gynecology | Admitting: Obstetrics & Gynecology

## 2016-07-21 ENCOUNTER — Inpatient Hospital Stay (HOSPITAL_COMMUNITY): Payer: Self-pay

## 2016-07-21 DIAGNOSIS — O3481 Maternal care for other abnormalities of pelvic organs, first trimester: Secondary | ICD-10-CM | POA: Insufficient documentation

## 2016-07-21 DIAGNOSIS — O99331 Smoking (tobacco) complicating pregnancy, first trimester: Secondary | ICD-10-CM | POA: Insufficient documentation

## 2016-07-21 DIAGNOSIS — O3680X0 Pregnancy with inconclusive fetal viability, not applicable or unspecified: Secondary | ICD-10-CM | POA: Insufficient documentation

## 2016-07-21 DIAGNOSIS — O26891 Other specified pregnancy related conditions, first trimester: Secondary | ICD-10-CM | POA: Insufficient documentation

## 2016-07-21 DIAGNOSIS — R109 Unspecified abdominal pain: Secondary | ICD-10-CM | POA: Insufficient documentation

## 2016-07-21 DIAGNOSIS — R102 Pelvic and perineal pain: Secondary | ICD-10-CM

## 2016-07-21 DIAGNOSIS — N8311 Corpus luteum cyst of right ovary: Secondary | ICD-10-CM | POA: Insufficient documentation

## 2016-07-21 DIAGNOSIS — Z3201 Encounter for pregnancy test, result positive: Secondary | ICD-10-CM | POA: Insufficient documentation

## 2016-07-21 DIAGNOSIS — F17210 Nicotine dependence, cigarettes, uncomplicated: Secondary | ICD-10-CM | POA: Insufficient documentation

## 2016-07-21 DIAGNOSIS — Z3A01 Less than 8 weeks gestation of pregnancy: Secondary | ICD-10-CM | POA: Insufficient documentation

## 2016-07-21 LAB — URINALYSIS, ROUTINE W REFLEX MICROSCOPIC
Bilirubin Urine: NEGATIVE
GLUCOSE, UA: NEGATIVE mg/dL
HGB URINE DIPSTICK: NEGATIVE
Ketones, ur: NEGATIVE mg/dL
Nitrite: NEGATIVE
Protein, ur: NEGATIVE mg/dL
SPECIFIC GRAVITY, URINE: 1.011 (ref 1.005–1.030)
pH: 6 (ref 5.0–8.0)

## 2016-07-21 LAB — POCT PREGNANCY, URINE: Preg Test, Ur: POSITIVE — AB

## 2016-07-21 LAB — HCG, QUANTITATIVE, PREGNANCY: hCG, Beta Chain, Quant, S: 6394 m[IU]/mL — ABNORMAL HIGH (ref <5)

## 2016-07-21 NOTE — Discharge Instructions (Signed)
Abdominal Pain During Pregnancy Belly (abdominal) pain is common during pregnancy. Most of the time, it is not a serious problem. Other times, it can be a sign that something is wrong with the pregnancy. Always tell your doctor if you have belly pain. Follow these instructions at home: Monitor your belly pain for any changes. The following actions may help you feel better:  Do not have sex (intercourse) or put anything in your vagina until you feel better.  Rest until your pain stops.  Drink clear fluids if you feel sick to your stomach (nauseous). Do not eat solid food until you feel better.  Only take medicine as told by your doctor.  Keep all doctor visits as told. Get help right away if:  You are bleeding, leaking fluid, or pieces of tissue come out of your vagina.  You have more pain or cramping.  You keep throwing up (vomiting).  You have pain when you pee (urinate) or have blood in your pee.  You have a fever.  You do not feel your baby moving as much.  You feel very weak or feel like passing out.  You have trouble breathing, with or without belly pain.  You have a very bad headache and belly pain.  You have fluid leaking from your vagina and belly pain.  You keep having watery poop (diarrhea).  Your belly pain does not go away after resting, or the pain gets worse. This information is not intended to replace advice given to you by your health care provider. Make sure you discuss any questions you have with your health care provider. Document Released: 02/21/2009 Document Revised: 10/12/2015 Document Reviewed: 10/02/2012 Elsevier Interactive Patient Education  2017 [employer].  First Trimester of Pregnancy The first trimester of pregnancy is from week 1 until the end of week 13 (months 1 through 3). A week after a sperm fertilizes an egg, the egg will implant on the wall of the uterus. This embryo will begin to develop into a baby. Genes from you and your partner  will form the baby. The female genes will determine whether the baby will be a boy or a girl. At 6-8 weeks, the eyes and face will be formed, and the heartbeat can be seen on ultrasound. At the end of 12 weeks, all the baby's organs will be formed. Now that you are pregnant, you will want to do everything you can to have a healthy baby. Two of the most important things are to get good prenatal care and to follow your health care provider's instructions. Prenatal care is all the medical care you receive before the baby's birth. This care will help prevent, find, and treat any problems during the pregnancy and childbirth. Body changes during your first trimester Your body goes through many changes during pregnancy. The changes vary from woman to woman.  You may gain or lose a couple of pounds at first.  You may feel sick to your stomach (nauseous) and you may throw up (vomit). If the vomiting is uncontrollable, call your health care provider.  You may tire easily.  You may develop headaches that can be relieved by medicines. All medicines should be approved by your health care provider.  You may urinate more often. Painful urination may mean you have a bladder infection.  You may develop heartburn as a result of your pregnancy.  You may develop constipation because certain hormones are causing the muscles that push stool through your intestines to slow down.  You may develop hemorrhoids or swollen veins (varicose veins).  Your breasts may begin to grow larger and become tender. Your nipples may stick out more, and the tissue that surrounds them (areola) may become darker.  Your gums may bleed and may be sensitive to brushing and flossing.  Dark spots or blotches (chloasma, mask of pregnancy) may develop on your face. This will likely fade after the baby is born.  Your menstrual periods will stop.  You may have a loss of appetite.  You may develop cravings for certain kinds of food.  You  may have changes in your emotions from day to day, such as being excited to be pregnant or being concerned that something may go wrong with the pregnancy and baby.  You may have more vivid and strange dreams.  You may have changes in your hair. These can include thickening of your hair, rapid growth, and changes in texture. Some women also have hair loss during or after pregnancy, or hair that feels dry or thin. Your hair will most likely return to normal after your baby is born. What to expect at prenatal visits During a routine prenatal visit:  You will be weighed to make sure you and the baby are growing normally.  Your blood pressure will be taken.  Your abdomen will be measured to track your baby's growth.  The fetal heartbeat will be listened to between weeks 10 and 14 of your pregnancy.  Test results from any previous visits will be discussed. Your health care provider may ask you:  How you are feeling.  If you are feeling the baby move.  If you have had any abnormal symptoms, such as leaking fluid, bleeding, severe headaches, or abdominal cramping.  If you are using any tobacco products, including cigarettes, chewing tobacco, and electronic cigarettes.  If you have any questions. Other tests that may be performed during your first trimester include:  Blood tests to find your blood type and to check for the presence of any previous infections. The tests will also be used to check for low iron levels (anemia) and protein on red blood cells (Rh antibodies). Depending on your risk factors, or if you previously had diabetes during pregnancy, you may have tests to check for high blood sugar that affects pregnant women (gestational diabetes).  Urine tests to check for infections, diabetes, or protein in the urine.  An ultrasound to confirm the proper growth and development of the baby.  Fetal screens for spinal cord problems (spina bifida) and Down syndrome.  HIV (human  immunodeficiency virus) testing. Routine prenatal testing includes screening for HIV, unless you choose not to have this test.  You may need other tests to make sure you and the baby are doing well. Follow these instructions at home: Medicines   Follow your health care provider's instructions regarding medicine use. Specific medicines may be either safe or unsafe to take during pregnancy.  Take a prenatal vitamin that contains at least 600 micrograms (mcg) of folic acid.  If you develop constipation, try taking a stool softener if your health care provider approves. Eating and drinking   Eat a balanced diet that includes fresh fruits and vegetables, whole grains, good sources of protein such as meat, eggs, or tofu, and low-fat dairy. Your health care provider will help you determine the amount of weight gain that is right for you.  Avoid raw meat and uncooked cheese. These carry germs that can cause birth defects in the baby.  Eating four or five small meals rather than three large meals a day may help relieve nausea and vomiting. If you start to feel nauseous, eating a few soda crackers can be helpful. Drinking liquids between meals, instead of during meals, also seems to help ease nausea and vomiting.  Limit foods that are high in fat and processed sugars, such as fried and sweet foods.  To prevent constipation:  Eat foods that are high in fiber, such as fresh fruits and vegetables, whole grains, and beans.  Drink enough fluid to keep your urine clear or pale yellow. Activity   Exercise only as directed by your health care provider. Most women can continue their usual exercise routine during pregnancy. Try to exercise for 30 minutes at least 5 days a week. Exercising will help you:  Control your weight.  Stay in shape.  Be prepared for labor and delivery.  Experiencing pain or cramping in the lower abdomen or lower back is a good sign that you should stop exercising. Check with  your health care provider before continuing with normal exercises.  Try to avoid standing for long periods of time. Move your legs often if you must stand in one place for a long time.  Avoid heavy lifting.  Wear low-heeled shoes and practice good posture.  You may continue to have sex unless your health care provider tells you not to. Relieving pain and discomfort   Wear a good support bra to relieve breast tenderness.  Take warm sitz baths to soothe any pain or discomfort caused by hemorrhoids. Use hemorrhoid cream if your health care provider approves.  Rest with your legs elevated if you have leg cramps or low back pain.  If you develop varicose veins in your legs, wear support hose. Elevate your feet for 15 minutes, 3-4 times a day. Limit salt in your diet. Prenatal care   Schedule your prenatal visits by the twelfth week of pregnancy. They are usually scheduled monthly at first, then more often in the last 2 months before delivery.  Write down your questions. Take them to your prenatal visits.  Keep all your prenatal visits as told by your health care provider. This is important. Safety   Wear your seat belt at all times when driving.  Make a list of emergency phone numbers, including numbers for family, friends, the hospital, and police and fire departments. General instructions   Ask your health care provider for a referral to a local prenatal education class. Begin classes no later than the beginning of month 6 of your pregnancy.  Ask for help if you have counseling or nutritional needs during pregnancy. Your health care provider can offer advice or refer you to specialists for help with various needs.  Do not use hot tubs, steam rooms, or saunas.  Do not douche or use tampons or scented sanitary pads.  Do not cross your legs for long periods of time.  Avoid cat litter boxes and soil used by cats. These carry germs that can cause birth defects in the baby and  possibly loss of the fetus by miscarriage or stillbirth.  Avoid all smoking, herbs, alcohol, and medicines not prescribed by your health care provider. Chemicals in these products affect the formation and growth of the baby.  Do not use any products that contain nicotine or tobacco, such as cigarettes and e-cigarettes. If you need help quitting, ask your health care provider. You may receive counseling support and other resources to help you quit.  Schedule a dentist appointment. At home, brush your teeth with a soft toothbrush and be gentle when you floss. Contact a health care provider if:  You have dizziness.  You have mild pelvic cramps, pelvic pressure, or nagging pain in the abdominal area.  You have persistent nausea, vomiting, or diarrhea.  You have a bad smelling vaginal discharge.  You have pain when you urinate.  You notice increased swelling in your face, hands, legs, or ankles.  You are exposed to fifth disease or chickenpox.  You are exposed to [place] measles (rubella) and have never had it. Get help right away if:  You have a fever.  You are leaking fluid from your vagina.  You have spotting or bleeding from your vagina.  You have severe abdominal cramping or pain.  You have rapid weight gain or loss.  You vomit blood or material that looks like coffee grounds.  You develop a severe headache.  You have shortness of breath.  You have any kind of trauma, such as from a fall or a car accident. Summary  The first trimester of pregnancy is from week 1 until the end of week 13 (months 1 through 3).  Your body goes through many changes during pregnancy. The changes vary from woman to woman.  You will have routine prenatal visits. During those visits, your health care provider will examine you, discuss any test results you may have, and talk with you about how you are feeling. This information is not intended to replace advice given to you by your health care  provider. Make sure you discuss any questions you have with your health care provider. Document Released: 02/27/2001 Document Revised: 02/15/2016 Document Reviewed: 02/15/2016 Elsevier Interactive Patient Education  2017 [employer]

## 2016-07-21 NOTE — MAU Note (Signed)
Patient had 2 positive UPTs, having cramping which started this morning, LMP 4/2, had spotting about 1 week ago which turned brown, none today.

## 2016-07-21 NOTE — MAU Provider Note (Signed)
History   G2P1001 @ 4.[redacted] wks gestation. Sure of LMP in with mild abd cramping. Denies any vag bleeding.  CSN: [id number]  Arrival date & time 07/21/16  1227   None     Chief Complaint  Patient presents with  . Abdominal Cramping    HPI  Past Medical History:  Diagnosis Date  . Medical history non-contributory     Past Surgical History:  Procedure Laterality Date  . NO PAST SURGERIES      No family history on file.  Social History  Substance Use Topics  . Smoking status: Current Every Day Smoker    Types: Cigarettes  . Smokeless tobacco: Never Used  . Alcohol use No    OB History    Gravida Para Term Preterm AB Living   3 1 1     1    SAB TAB Ectopic Multiple Live Births         0 1      Review of Systems  Constitutional: Negative.   HENT: Negative.   Eyes: Negative.   Respiratory: Negative.   Cardiovascular: Negative.   Gastrointestinal: Positive for abdominal pain.  Endocrine: Negative.   Genitourinary: Negative.   Musculoskeletal: Negative.   Skin: Negative.   Allergic/Immunologic: Negative.   Neurological: Negative.   Hematological: Negative.   Psychiatric/Behavioral: Negative.     Allergies  Patient has no known allergies.  Home Medications    BP 118/75 (BP Location: Right Arm)   Pulse 94   Temp 98.3 F (36.8 C) (Oral)   Resp 16   Ht 5\' 3"  (1.6 m)   Wt 147 lb 1.9 oz (66.7 kg)   LMP 06/18/2016 (Exact Date)   BMI 26.06 kg/m   Physical Exam  Constitutional: She is oriented to person, place, and time. She appears well-developed and well-nourished.  HENT:  Head: Normocephalic.  Eyes: Pupils are equal, round, and reactive to light.  Neck: Normal range of motion.  Cardiovascular: Normal rate, regular rhythm, normal heart sounds and intact distal pulses.   Pulmonary/Chest: Effort normal and breath sounds normal.  Abdominal: Soft. Bowel sounds are normal.  Musculoskeletal: Normal range of motion.  Neurological: She is alert and oriented  to person, place, and time. She has normal reflexes.  Skin: Skin is warm and dry.  Psychiatric: She has a normal mood and affect. Her behavior is normal. Judgment and thought content normal.    MAU Course  Procedures (including critical care time)  Labs Reviewed  POCT PREGNANCY, URINE - Abnormal; Notable for the following:       Result Value   Preg Test, Ur POSITIVE (*)    All other components within normal limits  URINALYSIS, ROUTINE W REFLEX MICROSCOPIC  HCG, QUANTITATIVE, PREGNANCY   No results found.   1. Abdominal pain in pregnancy, first trimester   2. Pregnancy of unknown anatomic location       MDM   G2P1001 @ 4.[redacted] wks gestation. Sure of LMP in with mild abd cramping. Denies any vag bleeding VSS, Heart RRR, LCTAB. abd soft and non tender. Plan: quant 6394. u/s to assess preg location. u/s shows early IUP with gest sac and yolk sac at 5.2 days. POC discussed with Dr. Penne LashLeggett pt to have repeat quant on Monday in clinic. Will d/c home

## 2016-07-21 NOTE — Progress Notes (Addendum)
g3P1 @ 4.[redacted] wksga. Presents to triage due to abdominal pain. Denies LOF or bleeding. Last definite period April 2. Provider at bs assessing pt. Lab and U/S ordered.   U/S notified.   1327: back from U/s.   1400: Quant test + for pregnancy. Provider at bs and informed pt to come to clinic for OB appt.   14: Discharge instructions given with pt understanding. Pt left unit via ambulatory w child.

## 2016-07-23 ENCOUNTER — Ambulatory Visit: Payer: Medicaid Other | Admitting: *Deleted

## 2016-07-23 ENCOUNTER — Encounter: Payer: Self-pay | Admitting: *Deleted

## 2016-07-23 DIAGNOSIS — Z3490 Encounter for supervision of normal pregnancy, unspecified, unspecified trimester: Secondary | ICD-10-CM

## 2016-07-23 LAB — HCG, QUANTITATIVE, PREGNANCY: hCG, Beta Chain, Quant, S: 10621 m[IU]/mL — ABNORMAL HIGH

## 2016-07-23 NOTE — Progress Notes (Signed)
Pt seen in MAU on 07/21/16 due to cramping. Her HCG at that time was 6394. US on same day showed probable intrauterine gestational sac. She was told to return today for STAT HCG level.    Pt reports some mild cramping. She had sex yesterday and noticed some spotting afterwards, it is mostly gone today. Advised patient that when her results come back I will discuss with MD and call her with a plan. Patient is agreeable to this and gave number that she can be reached at. No further questions or concerns at this time. [phone]  [place] [address]. Dr Erin FullingHarraway Smith recommends repeat u/s one week from today. US scheduled for May 15 at 1000. Attempted to call patient, no answer and no voicemail. Mychart message to patient with results and appointment information.

## 2016-07-31 ENCOUNTER — Ambulatory Visit (HOSPITAL_COMMUNITY)
Admission: RE | Admit: 2016-07-31 | Discharge: 2016-07-31 | Disposition: A | Discharge status: Home / Self Care | Payer: Self-pay | Source: Ambulatory Visit | Attending: Obstetrics & Gynecology | Admitting: Obstetrics & Gynecology

## 2016-07-31 ENCOUNTER — Ambulatory Visit: Payer: Self-pay | Admitting: *Deleted

## 2016-07-31 DIAGNOSIS — Z712 Person consulting for explanation of examination or test findings: Secondary | ICD-10-CM

## 2016-07-31 DIAGNOSIS — Z3490 Encounter for supervision of normal pregnancy, unspecified, unspecified trimester: Secondary | ICD-10-CM | POA: Insufficient documentation

## 2016-07-31 NOTE — Progress Notes (Signed)
Per Marylynn Pearsonarrie Hillman RN, she reviewed u/s results of viable pregnancy with patient who had to leave. Her concern was the fetal HR 101. I reviewed this with [place]ia Erickson CNM, who recommended no followup as this is wnl.

## 2016-08-20 IMAGING — US US OB COMP LESS 14 WK
1 series · 15 of 28 positions shown · non-contrast
Comparison: None.

CLINICAL DATA: Abdominal pain

EXAM:
OBSTETRIC <14 WK US AND TRANSVAGINAL OB US
TECHNIQUE: Both transabdominal and transvaginal ultrasound examinations were
performed for complete evaluation of the gestation as well as the
maternal uterus, adnexal regions, and pelvic cul-de-sac.
Transvaginal technique was performed to assess early pregnancy.

[Series 1: us ob comp less 14 wk · 32 acquisitions, 15 frames shown]
[im 1/32]
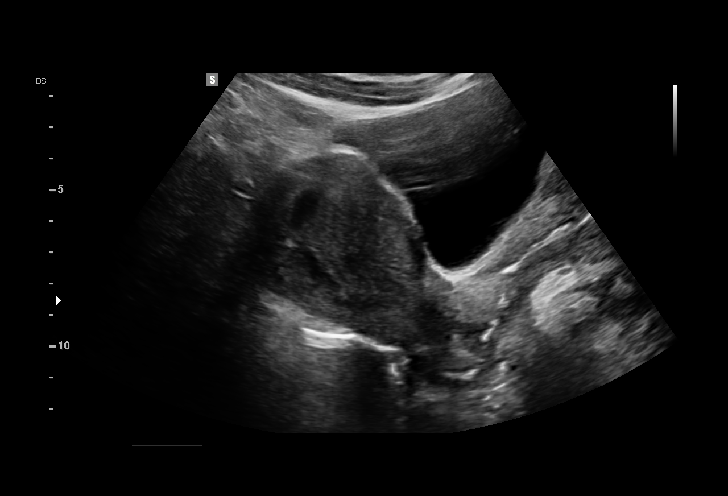
[im 3/32]
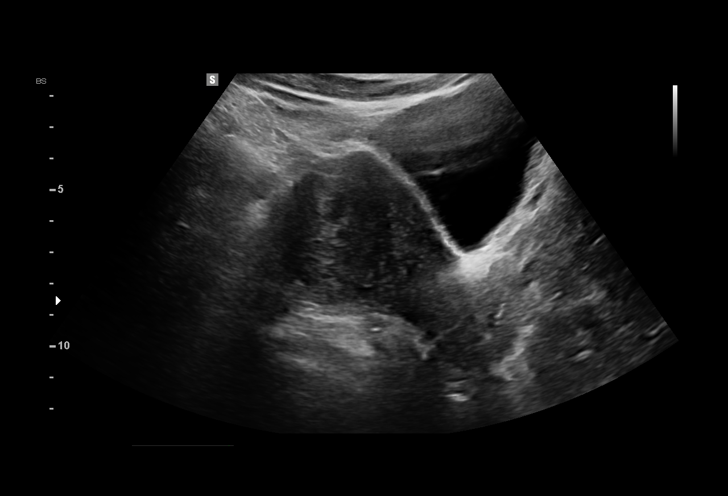
[im 5/32]
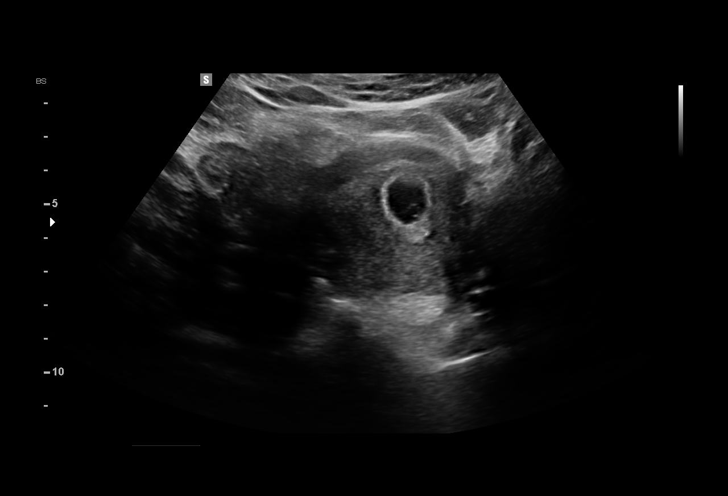
[im 7/32]
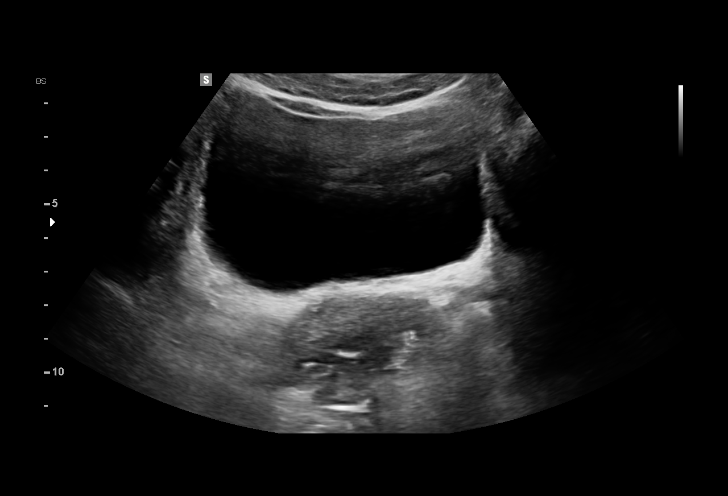
[im 10/32]
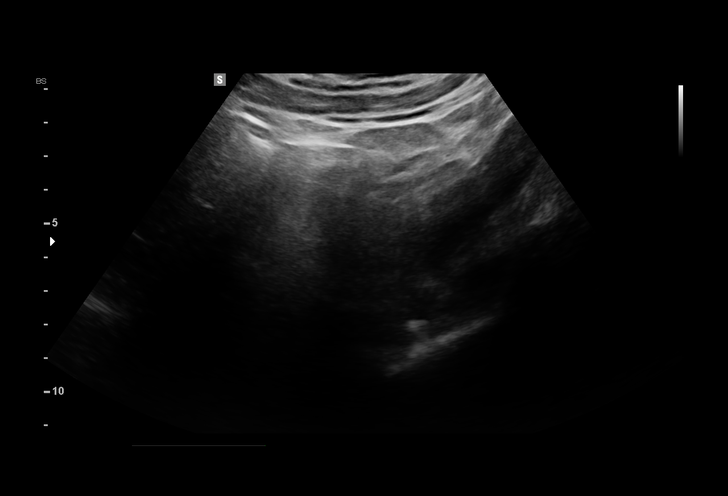
[im 12/32]
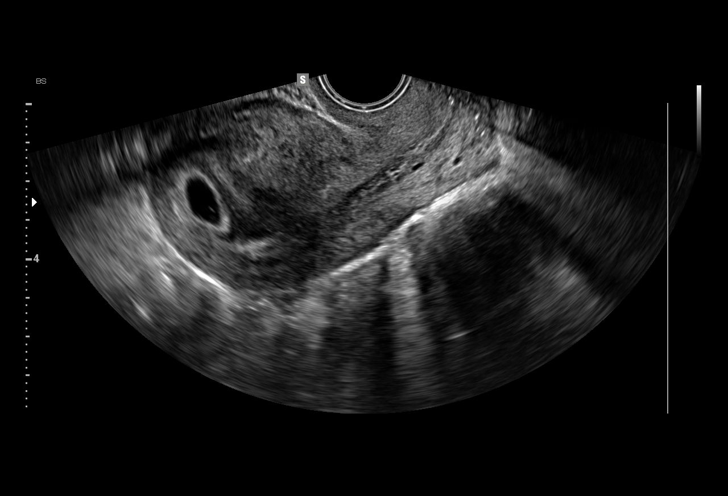
[im 14/32]
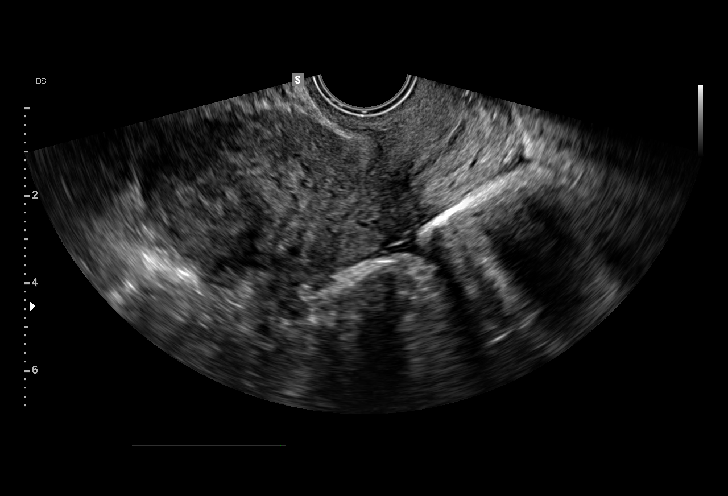
[im 17/32]
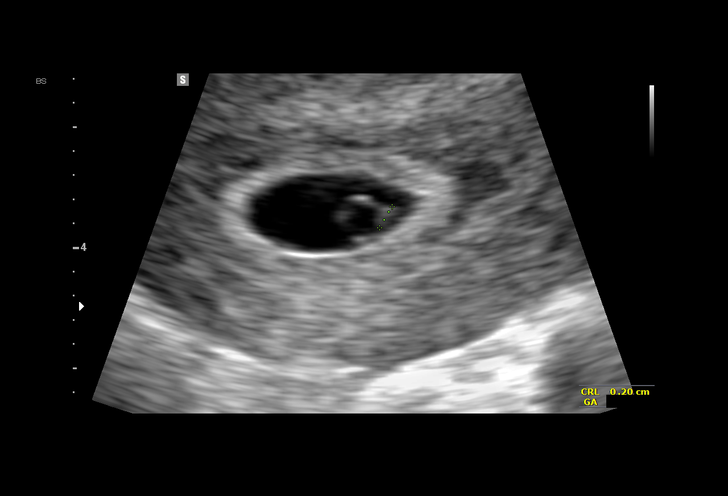
[im 18/32]
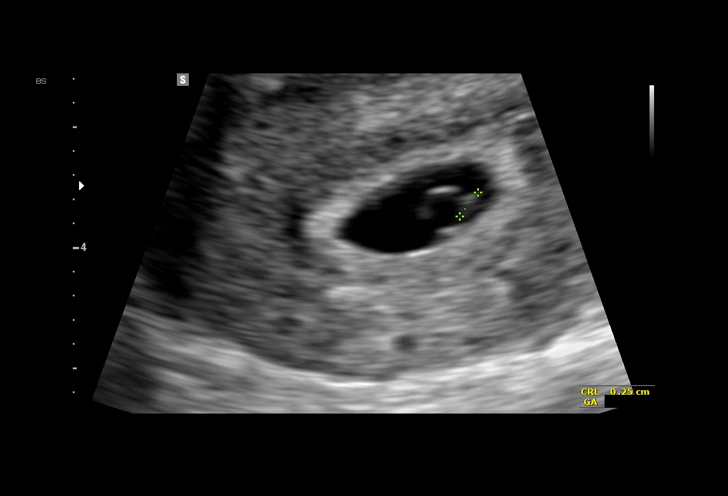
[im 20/32]
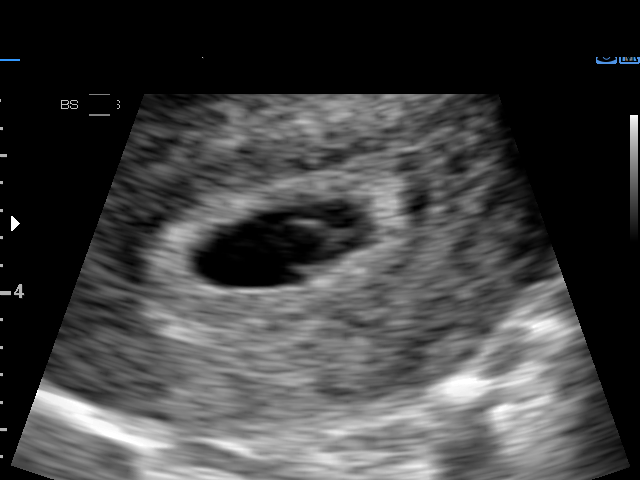
[im 22/32]
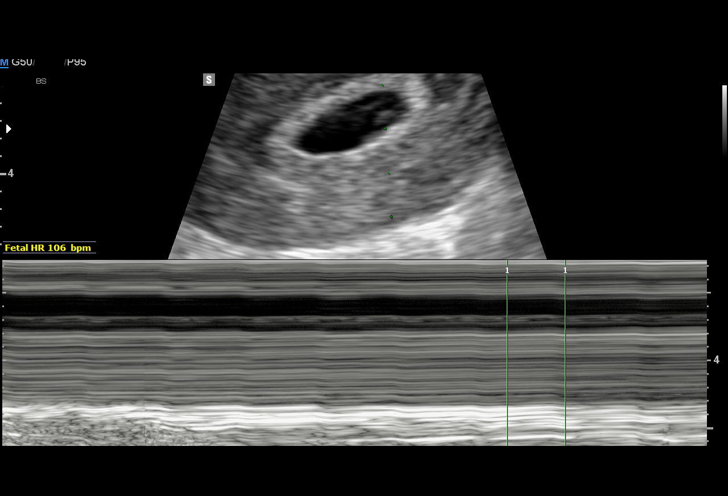
[im 25/32]
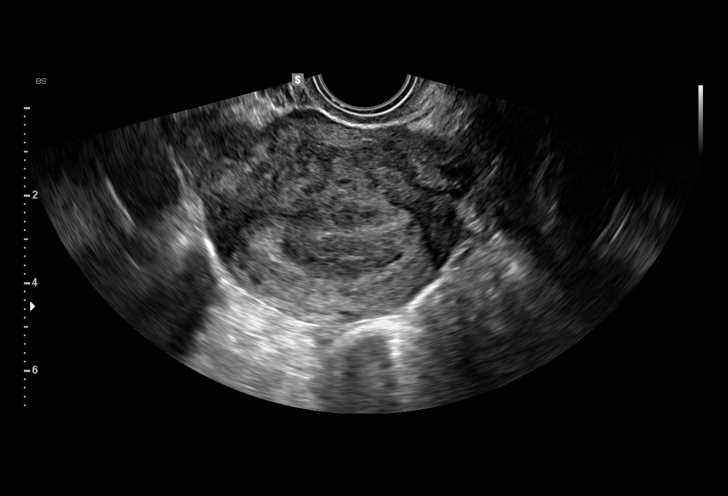
[im 27/32]
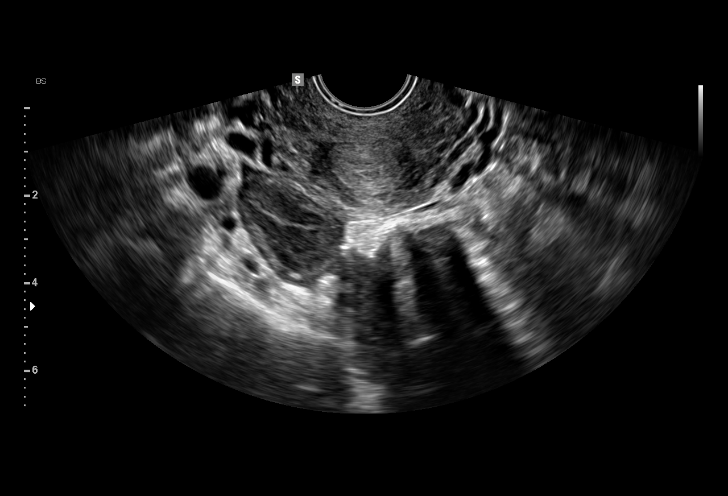
[im 29/32]
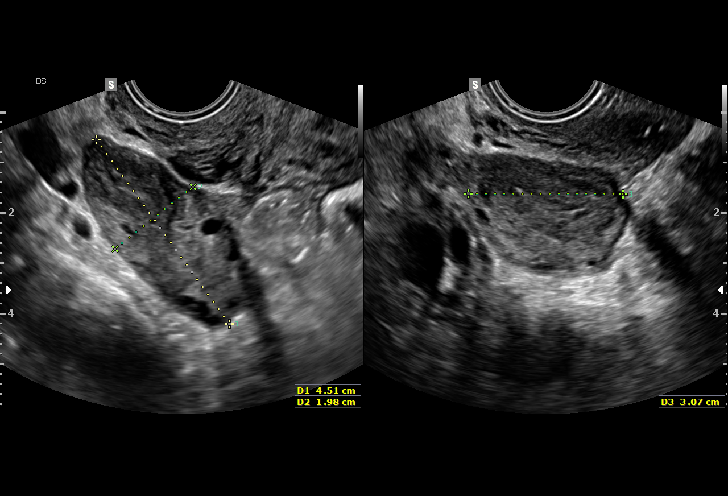
[im 32/32]
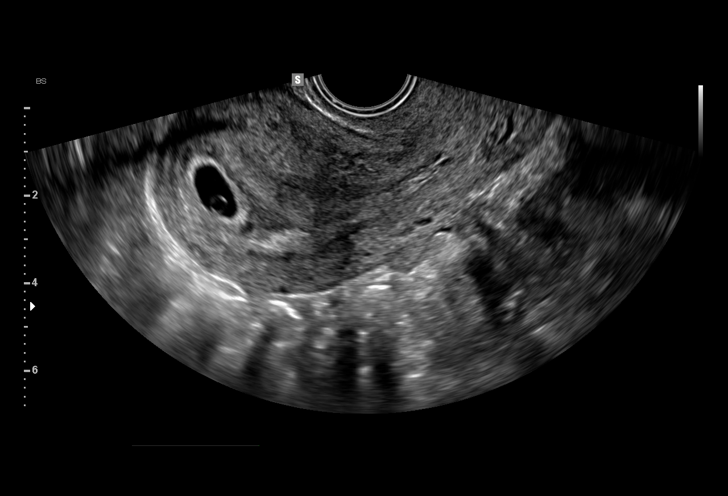

[15 of 28 positions shown; findings below may reference images not displayed]

FINDINGS: Intrauterine gestational sac: Single

Yolk sac:  Yes

Embryo:  Yes

Cardiac Activity: Yes

Heart Rate: 106  bpm

MSD:   mm    w     d

CRL:  2.3  mm   5 w   5 d                  US EDC: 03/21/2016

Subchorionic hemorrhage:  None visualized.

Maternal uterus/adnexae: Normal ovaries. No abnormal pelvic fluid
collections.
IMPRESSION: Single living intrauterine gestation measuring 5 weeks 5 days by
crown-rump length

## 2017-03-07 ENCOUNTER — Encounter (HOSPITAL_COMMUNITY): Payer: Self-pay

## 2017-03-07 ENCOUNTER — Inpatient Hospital Stay (HOSPITAL_COMMUNITY)
Admission: AD | Admit: 2017-03-07 | Discharge: 2017-03-08 | Disposition: A | Discharge status: Home / Self Care | Payer: Medicaid Other | Source: Ambulatory Visit | Attending: Obstetrics | Admitting: Obstetrics

## 2017-03-07 DIAGNOSIS — O471 False labor at or after 37 completed weeks of gestation: Secondary | ICD-10-CM

## 2017-03-07 DIAGNOSIS — O479 False labor, unspecified: Secondary | ICD-10-CM | POA: Insufficient documentation

## 2017-03-07 DIAGNOSIS — Z3A00 Weeks of gestation of pregnancy not specified: Secondary | ICD-10-CM | POA: Insufficient documentation

## 2017-03-07 HISTORY — DX: Contact with and (suspected) exposure to infections with a predominantly sexual mode of transmission: Z20.2

## 2017-03-07 NOTE — MAU Note (Signed)
Pt states that she was checked in the office 2 days ago and was 3cm. Reports u/c''s since 1700 tonight. Denies any vag bleeding or leaking. Fetal ;movement present per pt.

## 2017-03-08 DIAGNOSIS — Z3A00 Weeks of gestation of pregnancy not specified: Secondary | ICD-10-CM | POA: Diagnosis not present

## 2017-03-08 DIAGNOSIS — O479 False labor, unspecified: Secondary | ICD-10-CM | POA: Diagnosis present

## 2017-03-08 NOTE — MAU Note (Signed)
I have communicated with Dr. Chestine Sporelark and reviewed vital signs:  Vitals:   03/07/17 2308 03/08/17 0330  BP: 129/87 122/80  Pulse: 91 95  Resp: 16   Temp: 98.3 F (36.8 C)     Vaginal exam:  Dilation: 4 Effacement (%): 50 Cervical Position: Posterior Station: -3 Presentation: Vertex Exam by:: Camelia Enganielle Simpson RN,   Also reviewed contraction pattern and that non-stress test is reactive.  It has been documented that patient is contracting irregularly with no cervical change over 4 hours not indicating active labor.  Patient denies any other complaints.  Based on this report provider has given order for discharge.  A discharge order and diagnosis entered by a provider.   Labor discharge instructions reviewed with patient.

## 2017-03-08 NOTE — Discharge Instructions (Signed)
Braxton Hicks Contractions °Contractions of the uterus can occur throughout pregnancy, but they are not always a sign that you are in labor. You may have practice contractions called Braxton Hicks contractions. These false labor contractions are sometimes confused with true labor. °What are Braxton Hicks contractions? °Braxton Hicks contractions are tightening movements that occur in the muscles of the uterus before labor. Unlike true labor contractions, these contractions do not result in opening (dilation) and thinning of the cervix. Toward the end of pregnancy (32-34 weeks), Braxton Hicks contractions can happen more often and may become stronger. These contractions are sometimes difficult to tell apart from true labor because they can be very uncomfortable. You should not feel embarrassed if you go to the hospital with false labor. °Sometimes, the only way to tell if you are in true labor is for your health care provider to look for changes in the cervix. The health care provider will do a physical exam and may monitor your contractions. If you are not in true labor, the exam should show that your cervix is not dilating and your water has not broken. °If there are other health problems associated with your pregnancy, it is completely safe for you to be sent home with false labor. You may continue to have Braxton Hicks contractions until you go into true labor. °How to tell the difference between true labor and false labor °True labor °· Contractions last 30-70 seconds. °· Contractions become very regular. °· Discomfort is usually felt in the top of the uterus, and it spreads to the lower abdomen and low back. °· Contractions do not go away with walking. °· Contractions usually become more intense and increase in frequency. °· The cervix dilates and gets thinner. °False labor °· Contractions are usually shorter and not as strong as true labor contractions. °· Contractions are usually irregular. °· Contractions  are often felt in the front of the lower abdomen and in the groin. °· Contractions may go away when you walk around or change positions while lying down. °· Contractions get weaker and are shorter-lasting as time goes on. °· The cervix usually does not dilate or become thin. °Follow these instructions at home: °· Take over-the-counter and prescription medicines only as told by your health care provider. °· Keep up with your usual exercises and follow other instructions from your health care provider. °· Eat and drink lightly if you think you are going into labor. °· If Braxton Hicks contractions are making you uncomfortable: °? Change your position from lying down or resting to walking, or change from walking to resting. °? Sit and rest in a tub of warm water. °? Drink enough fluid to keep your urine pale yellow. Dehydration may cause these contractions. °? Do slow and deep breathing several times an hour. °· Keep all follow-up prenatal visits as told by your health care provider. This is important. °Contact a health care provider if: °· You have a fever. °· You have continuous pain in your abdomen. °Get help right away if: °· Your contractions become stronger, more regular, and closer together. °· You have fluid leaking or gushing from your vagina. °· You pass blood-tinged mucus (bloody show). °· You have bleeding from your vagina. °· You have low back pain that you never had before. °· You feel your baby’s head pushing down and causing pelvic pressure. °· Your baby is not moving inside you as much as it used to. °Summary °· Contractions that occur before labor are called Braxton   Hicks contractions, false labor, or practice contractions. °· Braxton Hicks contractions are usually shorter, weaker, farther apart, and less regular than true labor contractions. True labor contractions usually become progressively stronger and regular and they become more frequent. °· Manage discomfort from Braxton Hicks contractions by  changing position, resting in a warm bath, drinking plenty of water, or practicing deep breathing. °This information is not intended to replace advice given to you by your health care provider. Make sure you discuss any questions you have with your health care provider. °Document Released: 07/19/2016 Document Revised: 07/19/2016 Document Reviewed: 07/19/2016 °Elsevier Interactive Patient Education © 2018 [employer] ° °Fetal Movement Counts °Patient Name: ________________________________________________ Patient Due Date: ____________________ °What is a fetal movement count? °A fetal movement count is the number of times that you feel your baby move during a certain amount of time. This may also be called a fetal kick count. A fetal movement count is recommended for every pregnant woman. You may be asked to start counting fetal movements as early as week 28 of your pregnancy. °Pay attention to when your baby is most active. You may notice your baby's sleep and wake cycles. You may also notice things that make your baby move more. You should do a fetal movement count: °· When your baby is normally most active. °· At the same time each day. ° °A good time to count movements is while you are resting, after having something to eat and drink. °How do I count fetal movements? °1. Find a quiet, comfortable area. Sit, or lie down on your side. °2. Write down the date, the start time and stop time, and the number of movements that you felt between those two times. Take this information with you to your health care visits. °3. For 2 hours, count kicks, flutters, swishes, rolls, and jabs. You should feel at least 10 movements during 2 hours. °4. You may stop counting after you have felt 10 movements. °5. If you do not feel 10 movements in 2 hours, have something to eat and drink. Then, keep resting and counting for 1 hour. If you feel at least 4 movements during that hour, you may stop counting. °Contact a health care  provider if: °· You feel fewer than 4 movements in 2 hours. °· Your baby is not moving like he or she usually does. °Date: ____________ Start time: ____________ Stop time: ____________ Movements: ____________ °Date: ____________ Start time: ____________ Stop time: ____________ Movements: ____________ °Date: ____________ Start time: ____________ Stop time: ____________ Movements: ____________ °Date: ____________ Start time: ____________ Stop time: ____________ Movements: ____________ °Date: ____________ Start time: ____________ Stop time: ____________ Movements: ____________ °Date: ____________ Start time: ____________ Stop time: ____________ Movements: ____________ °Date: ____________ Start time: ____________ Stop time: ____________ Movements: ____________ °Date: ____________ Start time: ____________ Stop time: ____________ Movements: ____________ °Date: ____________ Start time: ____________ Stop time: ____________ Movements: ____________ °This information is not intended to replace advice given to you by your health care provider. Make sure you discuss any questions you have with your health care provider. °Document Released: 04/04/2006 Document Revised: 11/02/2015 Document Reviewed: 04/14/2015 °Elsevier Interactive Patient Education © 2018 [employer] ° °

## 2017-03-12 ENCOUNTER — Inpatient Hospital Stay (HOSPITAL_COMMUNITY)
Admission: AD | Admit: 2017-03-12 | Discharge: 2017-03-12 | Disposition: A | Discharge status: Home / Self Care | Payer: Medicaid Other | Source: Ambulatory Visit | Attending: Obstetrics and Gynecology | Admitting: Obstetrics and Gynecology

## 2017-03-12 ENCOUNTER — Encounter (HOSPITAL_COMMUNITY): Payer: Self-pay | Admitting: *Deleted

## 2017-03-12 DIAGNOSIS — O479 False labor, unspecified: Secondary | ICD-10-CM

## 2017-03-12 NOTE — MAU Note (Signed)
Pt c/o u/c's all day and most recently 3-4 min apart. Denies any vag bleeding or leaking. Reports good fetal movement.

## 2017-03-12 NOTE — Progress Notes (Signed)
Pt here for Term labor check. RN discussed VE and FHR tracing w/ Dr. Tenny Crawoss who discharged pt, but did not enter Dx of false labor. CNM asked to enter Dx. Reviewed FHR tracing. Few questionable mild variables and early decels followed by reactive tracing w/ intermittent MHR tracing at end. FHR obtained before D/C.  Katrinka BlazingSmith, [place], CNM 03/13/2017 12:20 AM

## 2017-03-12 NOTE — Discharge Instructions (Signed)
Braxton Hicks Contractions °Contractions of the uterus can occur throughout pregnancy, but they are not always a sign that you are in labor. You may have practice contractions called Braxton Hicks contractions. These false labor contractions are sometimes confused with true labor. °What are Braxton Hicks contractions? °Braxton Hicks contractions are tightening movements that occur in the muscles of the uterus before labor. Unlike true labor contractions, these contractions do not result in opening (dilation) and thinning of the cervix. Toward the end of pregnancy (32-34 weeks), Braxton Hicks contractions can happen more often and may become stronger. These contractions are sometimes difficult to tell apart from true labor because they can be very uncomfortable. You should not feel embarrassed if you go to the hospital with false labor. °Sometimes, the only way to tell if you are in true labor is for your health care provider to look for changes in the cervix. The health care provider will do a physical exam and may monitor your contractions. If you are not in true labor, the exam should show that your cervix is not dilating and your water has not broken. °If there are other health problems associated with your pregnancy, it is completely safe for you to be sent home with false labor. You may continue to have Braxton Hicks contractions until you go into true labor. °How to tell the difference between true labor and false labor °True labor °· Contractions last 30-70 seconds. °· Contractions become very regular. °· Discomfort is usually felt in the top of the uterus, and it spreads to the lower abdomen and low back. °· Contractions do not go away with walking. °· Contractions usually become more intense and increase in frequency. °· The cervix dilates and gets thinner. °False labor °· Contractions are usually shorter and not as strong as true labor contractions. °· Contractions are usually irregular. °· Contractions  are often felt in the front of the lower abdomen and in the groin. °· Contractions may go away when you walk around or change positions while lying down. °· Contractions get weaker and are shorter-lasting as time goes on. °· The cervix usually does not dilate or become thin. °Follow these instructions at home: °· Take over-the-counter and prescription medicines only as told by your health care provider. °· Keep up with your usual exercises and follow other instructions from your health care provider. °· Eat and drink lightly if you think you are going into labor. °· If Braxton Hicks contractions are making you uncomfortable: °? Change your position from lying down or resting to walking, or change from walking to resting. °? Sit and rest in a tub of warm water. °? Drink enough fluid to keep your urine pale yellow. Dehydration may cause these contractions. °? Do slow and deep breathing several times an hour. °· Keep all follow-up prenatal visits as told by your health care provider. This is important. °Contact a health care provider if: °· You have a fever. °· You have continuous pain in your abdomen. °Get help right away if: °· Your contractions become stronger, more regular, and closer together. °· You have fluid leaking or gushing from your vagina. °· You pass blood-tinged mucus (bloody show). °· You have bleeding from your vagina. °· You have low back pain that you never had before. °· You feel your baby’s head pushing down and causing pelvic pressure. °· Your baby is not moving inside you as much as it used to. °Summary °· Contractions that occur before labor are called Braxton   Hicks contractions, false labor, or practice contractions. °· Braxton Hicks contractions are usually shorter, weaker, farther apart, and less regular than true labor contractions. True labor contractions usually become progressively stronger and regular and they become more frequent. °· Manage discomfort from Braxton Hicks contractions by  changing position, resting in a warm bath, drinking plenty of water, or practicing deep breathing. °This information is not intended to replace advice given to you by your health care provider. Make sure you discuss any questions you have with your health care provider. °Document Released: 07/19/2016 Document Revised: 07/19/2016 Document Reviewed: 07/19/2016 °Elsevier Interactive Patient Education © 2018 [employer] ° °

## 2017-03-13 ENCOUNTER — Inpatient Hospital Stay (HOSPITAL_COMMUNITY): Payer: Medicaid Other | Admitting: Anesthesiology

## 2017-03-13 ENCOUNTER — Encounter (HOSPITAL_COMMUNITY): Payer: Self-pay | Admitting: *Deleted

## 2017-03-13 ENCOUNTER — Inpatient Hospital Stay (HOSPITAL_COMMUNITY)
Admission: AD | Admit: 2017-03-13 | Discharge: 2017-03-16 | DRG: 788 | Disposition: A | Discharge status: Home / Self Care | Payer: Medicaid Other | Source: Ambulatory Visit | Attending: Obstetrics and Gynecology | Admitting: Obstetrics and Gynecology

## 2017-03-13 ENCOUNTER — Encounter (HOSPITAL_COMMUNITY)
Admission: AD | Disposition: A | Discharge status: Home / Self Care | Payer: Self-pay | Source: Ambulatory Visit | Attending: Obstetrics and Gynecology

## 2017-03-13 DIAGNOSIS — Z3A38 38 weeks gestation of pregnancy: Secondary | ICD-10-CM | POA: Diagnosis not present

## 2017-03-13 DIAGNOSIS — O99334 Smoking (tobacco) complicating childbirth: Secondary | ICD-10-CM | POA: Diagnosis present

## 2017-03-13 DIAGNOSIS — O26893 Other specified pregnancy related conditions, third trimester: Secondary | ICD-10-CM | POA: Diagnosis present

## 2017-03-13 DIAGNOSIS — F17210 Nicotine dependence, cigarettes, uncomplicated: Secondary | ICD-10-CM | POA: Diagnosis present

## 2017-03-13 DIAGNOSIS — Z3483 Encounter for supervision of other normal pregnancy, third trimester: Secondary | ICD-10-CM | POA: Diagnosis present

## 2017-03-13 DIAGNOSIS — Z6791 Unspecified blood type, Rh negative: Secondary | ICD-10-CM

## 2017-03-13 DIAGNOSIS — O43123 Velamentous insertion of umbilical cord, third trimester: Secondary | ICD-10-CM | POA: Diagnosis present

## 2017-03-13 DIAGNOSIS — O99824 Streptococcus B carrier state complicating childbirth: Secondary | ICD-10-CM | POA: Diagnosis present

## 2017-03-13 DIAGNOSIS — O328XX0 Maternal care for other malpresentation of fetus, not applicable or unspecified: Secondary | ICD-10-CM | POA: Diagnosis present

## 2017-03-13 LAB — CBC
HEMATOCRIT: 40.2 % (ref 36.0–46.0)
HEMATOCRIT: 36.3 % (ref 36.0–46.0)
HEMOGLOBIN: 12.6 g/dL (ref 12.0–15.0)
Hemoglobin: 14.1 g/dL (ref 12.0–15.0)
MCH: 32.3 pg (ref 26.0–34.0)
MCH: 31.7 pg (ref 26.0–34.0)
MCHC: 35.1 g/dL (ref 30.0–36.0)
MCHC: 34.7 g/dL (ref 30.0–36.0)
MCV: 92.2 fL (ref 78.0–100.0)
MCV: 91.4 fL (ref 78.0–100.0)
PLATELETS: 261 10*3/uL (ref 150–400)
Platelets: 225 10*3/uL (ref 150–400)
RBC: 4.36 MIL/uL (ref 3.87–5.11)
RBC: 3.97 MIL/uL (ref 3.87–5.11)
RDW: 12.6 % (ref 11.5–15.5)
RDW: 12.6 % (ref 11.5–15.5)
WBC: 16.8 10*3/uL — ABNORMAL HIGH (ref 4.0–10.5)
WBC: 21 10*3/uL — AB (ref 4.0–10.5)

## 2017-03-13 LAB — TYPE AND SCREEN
ABO/RH(D): AB NEG
Antibody Screen: NEGATIVE

## 2017-03-13 LAB — RPR: RPR: NONREACTIVE

## 2017-03-13 SURGERY — Surgical Case
Anesthesia: Spinal | Site: Abdomen | Wound class: Clean Contaminated

## 2017-03-13 MED ORDER — TETANUS-DIPHTH-ACELL PERTUSSIS 5-2.5-18.5 LF-MCG/0.5 IM SUSP: 0.5000 mL | Freq: Once | INTRAMUSCULAR | Status: DC

## 2017-03-13 MED ORDER — AMPICILLIN SODIUM 2 G IJ SOLR
2.0000 g | Freq: Once | INTRAMUSCULAR | Status: AC
  Administered 2017-03-13: 2 g via INTRAVENOUS
  Filled 2017-03-13: qty 2000

## 2017-03-13 MED ORDER — ONDANSETRON HCL 4 MG/2ML IJ SOLN: 4.0000 mg | Freq: Four times a day (QID) | INTRAMUSCULAR | Status: DC | PRN

## 2017-03-13 MED ORDER — ONDANSETRON HCL 4 MG/2ML IJ SOLN
INTRAMUSCULAR | Status: DC | PRN
  Administered 2017-03-13: 4 mg via INTRAVENOUS

## 2017-03-13 MED ORDER — ACETAMINOPHEN 500 MG PO TABS
1000.0000 mg | ORAL_TABLET | Freq: Four times a day (QID) | ORAL | Status: AC
  Administered 2017-03-13 (×3): 1000 mg via ORAL
  Filled 2017-03-13 (×3): qty 2

## 2017-03-13 MED ORDER — FENTANYL CITRATE (PF) 100 MCG/2ML IJ SOLN
INTRAMUSCULAR | Status: AC
  Filled 2017-03-13: qty 2

## 2017-03-13 MED ORDER — PRENATAL MULTIVITAMIN CH
1.0000 | ORAL_TABLET | Freq: Every day | ORAL | Status: DC
  Administered 2017-03-13 – 2017-03-16 (×4): 1 via ORAL
  Filled 2017-03-13 (×4): qty 1

## 2017-03-13 MED ORDER — CEFAZOLIN SODIUM-DEXTROSE 2-3 GM-%(50ML) IV SOLR
INTRAVENOUS | Status: DC | PRN
  Administered 2017-03-13: 2 g via INTRAVENOUS

## 2017-03-13 MED ORDER — MORPHINE SULFATE (PF) 0.5 MG/ML IJ SOLN
INTRAMUSCULAR | Status: AC
  Filled 2017-03-13: qty 10

## 2017-03-13 MED ORDER — LACTATED RINGERS IV SOLN: 500.0000 mL | Freq: Once | INTRAVENOUS | Status: DC

## 2017-03-13 MED ORDER — KETOROLAC TROMETHAMINE 30 MG/ML IJ SOLN: 30.0000 mg | Freq: Four times a day (QID) | INTRAMUSCULAR | Status: AC | PRN

## 2017-03-13 MED ORDER — ONDANSETRON HCL 4 MG/2ML IJ SOLN: 4.0000 mg | Freq: Three times a day (TID) | INTRAMUSCULAR | Status: DC | PRN

## 2017-03-13 MED ORDER — SIMETHICONE 80 MG PO CHEW
80.0000 mg | CHEWABLE_TABLET | Freq: Three times a day (TID) | ORAL | Status: DC
  Administered 2017-03-13 – 2017-03-16 (×7): 80 mg via ORAL
  Filled 2017-03-13 (×7): qty 1

## 2017-03-13 MED ORDER — MENTHOL 3 MG MT LOZG: 1.0000 | LOZENGE | OROMUCOSAL | Status: DC | PRN

## 2017-03-13 MED ORDER — SODIUM CHLORIDE 0.9% FLUSH: 3.0000 mL | INTRAVENOUS | Status: DC | PRN

## 2017-03-13 MED ORDER — IBUPROFEN 600 MG PO TABS
600.0000 mg | ORAL_TABLET | Freq: Four times a day (QID) | ORAL | Status: DC
  Administered 2017-03-13 – 2017-03-16 (×12): 600 mg via ORAL
  Filled 2017-03-13 (×13): qty 1

## 2017-03-13 MED ORDER — BUPIVACAINE IN DEXTROSE 0.75-8.25 % IT SOLN
INTRATHECAL | Status: DC | PRN
  Administered 2017-03-13: 1.4 mL via INTRATHECAL

## 2017-03-13 MED ORDER — LACTATED RINGERS IV SOLN: 500.0000 mL | INTRAVENOUS | Status: DC | PRN

## 2017-03-13 MED ORDER — OXYTOCIN BOLUS FROM INFUSION: 500.0000 mL | Freq: Once | INTRAVENOUS | Status: DC

## 2017-03-13 MED ORDER — METHYLERGONOVINE MALEATE 0.2 MG PO TABS: 0.2000 mg | ORAL_TABLET | ORAL | Status: DC | PRN

## 2017-03-13 MED ORDER — SCOPOLAMINE 1 MG/3DAYS TD PT72
1.0000 | MEDICATED_PATCH | Freq: Once | TRANSDERMAL | Status: DC
  Filled 2017-03-13: qty 1

## 2017-03-13 MED ORDER — MEPERIDINE HCL 25 MG/ML IJ SOLN
INTRAMUSCULAR | Status: AC
  Filled 2017-03-13: qty 1

## 2017-03-13 MED ORDER — NALBUPHINE HCL 10 MG/ML IJ SOLN: 5.0000 mg | INTRAMUSCULAR | Status: DC | PRN

## 2017-03-13 MED ORDER — ACETAMINOPHEN 325 MG PO TABS: 650.0000 mg | ORAL_TABLET | ORAL | Status: DC | PRN

## 2017-03-13 MED ORDER — NALBUPHINE HCL 10 MG/ML IJ SOLN: 5.0000 mg | Freq: Once | INTRAMUSCULAR | Status: DC | PRN

## 2017-03-13 MED ORDER — DEXAMETHASONE SODIUM PHOSPHATE 10 MG/ML IJ SOLN
INTRAMUSCULAR | Status: DC | PRN
  Administered 2017-03-13: 10 mg via INTRAVENOUS

## 2017-03-13 MED ORDER — ZOLPIDEM TARTRATE 5 MG PO TABS: 5.0000 mg | ORAL_TABLET | Freq: Every evening | ORAL | Status: DC | PRN

## 2017-03-13 MED ORDER — OXYTOCIN 40 UNITS IN LACTATED RINGERS INFUSION - SIMPLE MED: 2.5000 [IU]/h | INTRAVENOUS | Status: AC

## 2017-03-13 MED ORDER — OXYTOCIN 10 UNIT/ML IJ SOLN
INTRAMUSCULAR | Status: DC | PRN
  Administered 2017-03-13: 40 [IU] via INTRAVENOUS

## 2017-03-13 MED ORDER — SCOPOLAMINE 1 MG/3DAYS TD PT72
MEDICATED_PATCH | TRANSDERMAL | Status: DC | PRN
  Administered 2017-03-13: 1 via TRANSDERMAL

## 2017-03-13 MED ORDER — SOD CITRATE-CITRIC ACID 500-334 MG/5ML PO SOLN
30.0000 mL | ORAL | Status: DC | PRN
  Administered 2017-03-13: 30 mL via ORAL
  Filled 2017-03-13: qty 15

## 2017-03-13 MED ORDER — PHENYLEPHRINE 40 MCG/ML (10ML) SYRINGE FOR IV PUSH (FOR BLOOD PRESSURE SUPPORT): 80.0000 ug | PREFILLED_SYRINGE | INTRAVENOUS | Status: DC | PRN

## 2017-03-13 MED ORDER — LIDOCAINE HCL (PF) 1 % IJ SOLN: 30.0000 mL | INTRAMUSCULAR | Status: DC | PRN

## 2017-03-13 MED ORDER — IPRATROPIUM BROMIDE 0.06 % NA SOLN
2.0000 | Freq: Four times a day (QID) | NASAL | Status: DC
  Filled 2017-03-13: qty 15

## 2017-03-13 MED ORDER — DIBUCAINE 1 % RE OINT: 1.0000 "application " | TOPICAL_OINTMENT | RECTAL | Status: DC | PRN

## 2017-03-13 MED ORDER — FENTANYL CITRATE (PF) 100 MCG/2ML IJ SOLN: 50.0000 ug | INTRAMUSCULAR | Status: DC | PRN

## 2017-03-13 MED ORDER — FLEET ENEMA 7-19 GM/118ML RE ENEM: 1.0000 | ENEMA | RECTAL | Status: DC | PRN

## 2017-03-13 MED ORDER — DIPHENHYDRAMINE HCL 50 MG/ML IJ SOLN: 12.5000 mg | INTRAMUSCULAR | Status: DC | PRN

## 2017-03-13 MED ORDER — SCOPOLAMINE 1 MG/3DAYS TD PT72
MEDICATED_PATCH | TRANSDERMAL | Status: AC
  Filled 2017-03-13: qty 1

## 2017-03-13 MED ORDER — LACTATED RINGERS IV SOLN
INTRAVENOUS | Status: DC
  Administered 2017-03-13: 16:00:00 via INTRAVENOUS

## 2017-03-13 MED ORDER — MORPHINE SULFATE (PF) 0.5 MG/ML IJ SOLN
INTRAMUSCULAR | Status: DC | PRN
  Administered 2017-03-13: .2 mg via INTRATHECAL

## 2017-03-13 MED ORDER — SIMETHICONE 80 MG PO CHEW
80.0000 mg | CHEWABLE_TABLET | ORAL | Status: DC
  Administered 2017-03-13 – 2017-03-15 (×2): 80 mg via ORAL
  Filled 2017-03-13 (×3): qty 1

## 2017-03-13 MED ORDER — WITCH HAZEL-GLYCERIN EX PADS: 1.0000 "application " | MEDICATED_PAD | CUTANEOUS | Status: DC | PRN

## 2017-03-13 MED ORDER — EPHEDRINE 5 MG/ML INJ: 10.0000 mg | INTRAVENOUS | Status: DC | PRN

## 2017-03-13 MED ORDER — METHYLERGONOVINE MALEATE 0.2 MG/ML IJ SOLN: 0.2000 mg | INTRAMUSCULAR | Status: DC | PRN

## 2017-03-13 MED ORDER — OXYCODONE-ACETAMINOPHEN 5-325 MG PO TABS
1.0000 | ORAL_TABLET | ORAL | Status: DC | PRN
  Administered 2017-03-14 – 2017-03-15 (×4): 1 via ORAL
  Filled 2017-03-13 (×4): qty 1

## 2017-03-13 MED ORDER — OXYTOCIN 40 UNITS IN LACTATED RINGERS INFUSION - SIMPLE MED: 2.5000 [IU]/h | INTRAVENOUS | Status: DC

## 2017-03-13 MED ORDER — OXYCODONE-ACETAMINOPHEN 5-325 MG PO TABS: 1.0000 | ORAL_TABLET | ORAL | Status: DC | PRN

## 2017-03-13 MED ORDER — DEXTROSE 5 % IV SOLN
1.0000 ug/kg/h | INTRAVENOUS | Status: DC | PRN
  Filled 2017-03-13: qty 5

## 2017-03-13 MED ORDER — OXYCODONE-ACETAMINOPHEN 5-325 MG PO TABS
2.0000 | ORAL_TABLET | ORAL | Status: DC | PRN
  Administered 2017-03-15 – 2017-03-16 (×3): 2 via ORAL
  Filled 2017-03-13 (×3): qty 2

## 2017-03-13 MED ORDER — COCONUT OIL OIL: 1.0000 "application " | TOPICAL_OIL | Status: DC | PRN

## 2017-03-13 MED ORDER — SENNOSIDES-DOCUSATE SODIUM 8.6-50 MG PO TABS
2.0000 | ORAL_TABLET | ORAL | Status: DC
  Administered 2017-03-13 – 2017-03-15 (×3): 2 via ORAL
  Filled 2017-03-13 (×3): qty 2

## 2017-03-13 MED ORDER — FENTANYL CITRATE (PF) 100 MCG/2ML IJ SOLN
INTRAMUSCULAR | Status: DC | PRN
  Administered 2017-03-13: 20 ug via INTRATHECAL

## 2017-03-13 MED ORDER — ONDANSETRON HCL 4 MG/2ML IJ SOLN
INTRAMUSCULAR | Status: AC
  Filled 2017-03-13: qty 2

## 2017-03-13 MED ORDER — KETOROLAC TROMETHAMINE 30 MG/ML IJ SOLN
30.0000 mg | Freq: Once | INTRAMUSCULAR | Status: DC | PRN
  Administered 2017-03-13: 30 mg via INTRAVENOUS

## 2017-03-13 MED ORDER — MEPERIDINE HCL 25 MG/ML IJ SOLN: 6.2500 mg | INTRAMUSCULAR | Status: DC | PRN

## 2017-03-13 MED ORDER — FENTANYL 2.5 MCG/ML BUPIVACAINE 1/10 % EPIDURAL INFUSION (WH - ANES): 14.0000 mL/h | INTRAMUSCULAR | Status: DC | PRN

## 2017-03-13 MED ORDER — DIPHENHYDRAMINE HCL 25 MG PO CAPS: 25.0000 mg | ORAL_CAPSULE | ORAL | Status: DC | PRN

## 2017-03-13 MED ORDER — LACTATED RINGERS IV SOLN
INTRAVENOUS | Status: DC | PRN
  Administered 2017-03-13 (×3): via INTRAVENOUS

## 2017-03-13 MED ORDER — NALOXONE HCL 0.4 MG/ML IJ SOLN: 0.4000 mg | INTRAMUSCULAR | Status: DC | PRN

## 2017-03-13 MED ORDER — HYDROMORPHONE HCL 1 MG/ML IJ SOLN: 0.2500 mg | INTRAMUSCULAR | Status: DC | PRN

## 2017-03-13 MED ORDER — SIMETHICONE 80 MG PO CHEW: 80.0000 mg | CHEWABLE_TABLET | ORAL | Status: DC | PRN

## 2017-03-13 MED ORDER — OXYCODONE-ACETAMINOPHEN 5-325 MG PO TABS: 2.0000 | ORAL_TABLET | ORAL | Status: DC | PRN

## 2017-03-13 MED ORDER — DEXAMETHASONE SODIUM PHOSPHATE 10 MG/ML IJ SOLN
INTRAMUSCULAR | Status: AC
  Filled 2017-03-13: qty 1

## 2017-03-13 MED ORDER — KETOROLAC TROMETHAMINE 30 MG/ML IJ SOLN
INTRAMUSCULAR | Status: AC
Start: 2017-03-13 — End: 2017-03-13
  Filled 2017-03-13: qty 1

## 2017-03-13 MED ORDER — DIPHENHYDRAMINE HCL 25 MG PO CAPS: 25.0000 mg | ORAL_CAPSULE | Freq: Four times a day (QID) | ORAL | Status: DC | PRN

## 2017-03-13 MED ORDER — PROMETHAZINE HCL 25 MG/ML IJ SOLN: 6.2500 mg | INTRAMUSCULAR | Status: DC | PRN

## 2017-03-13 MED ORDER — MEPERIDINE HCL 25 MG/ML IJ SOLN
INTRAMUSCULAR | Status: DC | PRN
  Administered 2017-03-13: 12.5 mg via INTRAVENOUS

## 2017-03-13 MED ORDER — LACTATED RINGERS IV SOLN
INTRAVENOUS | Status: DC
  Administered 2017-03-13: 03:00:00 via INTRAVENOUS

## 2017-03-13 MED ORDER — OXYTOCIN 10 UNIT/ML IJ SOLN
INTRAMUSCULAR | Status: AC
  Filled 2017-03-13: qty 4

## 2017-03-13 MED ORDER — PHENYLEPHRINE HCL 10 MG/ML IJ SOLN
INTRAMUSCULAR | Status: DC | PRN
  Administered 2017-03-13 (×5): 80 ug via INTRAVENOUS

## 2017-03-13 SURGICAL SUPPLY — 37 items
ADH SKN CLS APL DERMABOND .7 (GAUZE/BANDAGES/DRESSINGS)
ADH SKN CLS LQ APL DERMABOND (GAUZE/BANDAGES/DRESSINGS) ×1
CHLORAPREP W/TINT 26ML (MISCELLANEOUS) ×3 IMPLANT
CLAMP CORD UMBIL (MISCELLANEOUS) IMPLANT
CLOTH BEACON ORANGE TIMEOUT ST (SAFETY) ×3 IMPLANT
DERMABOND ADHESIVE PROPEN (GAUZE/BANDAGES/DRESSINGS) ×2
DERMABOND ADVANCED (GAUZE/BANDAGES/DRESSINGS)
DERMABOND ADVANCED .7 DNX12 (GAUZE/BANDAGES/DRESSINGS) IMPLANT
DERMABOND ADVANCED .7 DNX6 (GAUZE/BANDAGES/DRESSINGS) IMPLANT
DRSG OPSITE POSTOP 4X10 (GAUZE/BANDAGES/DRESSINGS) ×3 IMPLANT
ELECT REM PT RETURN 9FT ADLT (ELECTROSURGICAL) ×3
ELECTRODE REM PT RTRN 9FT ADLT (ELECTROSURGICAL) ×1 IMPLANT
EXTRACTOR VACUUM M CUP 4 TUBE (SUCTIONS) IMPLANT
EXTRACTOR VACUUM M CUP 4' TUBE (SUCTIONS)
GLOVE BIO SURGEON STRL SZ7 (GLOVE) ×3 IMPLANT
GLOVE BIOGEL PI IND STRL 7.0 (GLOVE) ×1 IMPLANT
GLOVE BIOGEL PI INDICATOR 7.0 (GLOVE) ×2
GOWN STRL REUS W/TWL LRG LVL3 (GOWN DISPOSABLE) ×6 IMPLANT
KIT ABG SYR 3ML LUER SLIP (SYRINGE) ×2 IMPLANT
NDL HYPO 25X5/8 SAFETYGLIDE (NEEDLE) IMPLANT
NEEDLE HYPO 22GX1.5 SAFETY (NEEDLE) IMPLANT
NEEDLE HYPO 25X5/8 SAFETYGLIDE (NEEDLE) ×3 IMPLANT
NS IRRIG 1000ML POUR BTL (IV SOLUTION) ×3 IMPLANT
PACK C SECTION WH (CUSTOM PROCEDURE TRAY) ×3 IMPLANT
PAD OB MATERNITY 4.3X12.25 (PERSONAL CARE ITEMS) ×3 IMPLANT
PENCIL SMOKE EVAC W/HOLSTER (ELECTROSURGICAL) ×3 IMPLANT
RTRCTR C-SECT PINK 25CM LRG (MISCELLANEOUS) ×3 IMPLANT
SUT CHROMIC 1 CTX 36 (SUTURE) ×6 IMPLANT
SUT CHROMIC 2 0 CT 1 (SUTURE) ×3 IMPLANT
SUT PDS AB 0 CTX 60 (SUTURE) ×3 IMPLANT
SUT PLAIN 2 0 XLH (SUTURE) ×2 IMPLANT
SUT VIC AB 2-0 CT1 27 (SUTURE) ×3
SUT VIC AB 2-0 CT1 TAPERPNT 27 (SUTURE) ×1 IMPLANT
SUT VIC AB 4-0 KS 27 (SUTURE) IMPLANT
SYR 30ML LL (SYRINGE) IMPLANT
TOWEL OR 17X24 6PK STRL BLUE (TOWEL DISPOSABLE) ×3 IMPLANT
TRAY FOLEY BAG SILVER LF 14FR (SET/KITS/TRAYS/PACK) ×3 IMPLANT

## 2017-03-13 NOTE — Progress Notes (Signed)
Dr. Tenny Crawoss at bedside following patient's fetal heart rate tracing. Decision was made to proceed with emergency cesarean section related to non-reassuring fetal heart tones. Consent was obtained risks and benefits discussed with patient, anesthesia was notified and code cesarean initiated. Patient transported to OR by nurses and Dr. Tenny Crawoss.

## 2017-03-13 NOTE — Anesthesia Procedure Notes (Signed)
Spinal  Patient location during procedure: OR Start time: 03/13/2017 3:57 AM End time: 03/13/2017 3:59 AM Staffing Anesthesiologist: Leilani AbleHatchett, Milanie Rosenfield, MD Performed: anesthesiologist  Preanesthetic Checklist Completed: patient identified, surgical consent, pre-op evaluation, timeout performed, IV checked, risks and benefits discussed and monitors and equipment checked Spinal Block Patient position: sitting Prep: site prepped and draped and DuraPrep Patient monitoring: continuous pulse ox and blood pressure Approach: midline Location: L3-4 Injection technique: single-shot Needle Needle type: Pencan  Needle length: 10 cm Needle insertion depth: 7 cm Assessment Sensory level: T4

## 2017-03-13 NOTE — H&P (Signed)
Kristine Erickson is a 21 y.o. female presenting for contractions  21 year old G2 P1 001 at 38+2 presents to maternity admissions complaining of painful contractions.  This is her second presentation.  She previously was here on 12/25 with the same complaint.  Her cervical exam was unchanged from her prior cervical exam in the office.  After 2 hours she made no appreciable cervical change with an irregular contractions.  She had a category 1 tracing.  Therefore she was discharged home  Upon her second presentation she was noted to be having severe variables to the 60s when placed on the fetal tracing.  Her cervical exam was 7 cm.  She was transferred to labor and delivery.  While in labor and delivery the fetal tracing changed from a category 2 tracing to a category 3 tracing with sustained fetal bradycardia.  Therefore, the decision was made to proceed with a new emergent cesarean section. OB History    Gravida Para Term Preterm AB Living   3 1 1   1 1    SAB TAB Ectopic Multiple Live Births   1     0 1     Past Medical History:  Diagnosis Date  . Chlamydia contact, treated   . Medical history non-contributory    Past Surgical History:  Procedure Laterality Date  . NO PAST SURGERIES     Family History: family history is not on file. Social History:  reports that she has been smoking cigarettes.  she has never used smokeless tobacco. She reports that she does not drink alcohol or use drugs.     Maternal Diabetes: No Genetic Screening: Declined Maternal Ultrasounds/Referrals: Normal Fetal Ultrasounds or other Referrals:  None Maternal Substance Abuse:  No Significant Maternal Medications:  None Significant Maternal Lab Results:  None Other Comments:  None  ROS History Dilation: 7 Effacement (%): 100 Station: -2 Exam by:: Kenly Xiao MD Blood pressure 120/73, pulse (!) 101, temperature 97.7 F (36.5 C), temperature source Oral, resp. rate 19, last menstrual period 06/18/2016, SpO2 (!)  87 %, unknown if currently breastfeeding. Exam Physical Exam  Prenatal labs: ABO, Rh: --/--/AB NEG (12/26 0315) Antibody: NEG (12/26 0315) Rubella:  Imm RPR: Non Reactive (03/23 2014)  HBsAg:   Nr HIV:   nr GBS:    pos  Assessment/Plan: 1) admit 2) proceed with emergent cesarean section for category 3 tracing.  Risks/benefits/alternatives were discussed with the patient and she wishes to proceed.   Waynard ReedsKendra Yvonne Stopher 03/13/2017, 5:10 AM

## 2017-03-13 NOTE — MAU Provider Note (Signed)
Pt here for term labor check. CNM observed repetitive variables on monitor, mod variability. Called Dr Tenny Crawoss and notified her of decels, VE 7 cm per RN. Will take 15 to get to hospital. [facility] house, called to unit for decels. In MAU soon after. [facility]   Katrinka BlazingSmith, [place], CNM 03/13/2017 2:56 AM

## 2017-03-13 NOTE — Anesthesia Preprocedure Evaluation (Signed)
Anesthesia Evaluation  Patient identified by MRN, date of birth, ID band Patient awake    Reviewed: Allergy & Precautions, H&P , NPO status , Patient's Chart, lab work & pertinent test results  Airway Mallampati: II  TM Distance: >3 FB Neck ROM: full    Dental no notable dental hx. (+) Teeth Intact   Pulmonary neg pulmonary ROS, Current Smoker,    Pulmonary exam normal breath sounds clear to auscultation       Cardiovascular negative cardio ROS Normal cardiovascular exam Rhythm:regular Rate:Normal     Neuro/Psych negative neurological ROS  negative psych ROS   GI/Hepatic negative GI ROS, Neg liver ROS,   Endo/Other  negative endocrine ROS  Renal/GU negative Renal ROS  negative genitourinary   Musculoskeletal   Abdominal (+) + obese,   Peds  Hematology negative hematology ROS (+)   Anesthesia Other Findings   Reproductive/Obstetrics (+) Pregnancy                             Anesthesia Physical Anesthesia Plan  ASA: II and emergent  Anesthesia Plan: Spinal   Post-op Pain Management:    Induction:   PONV Risk Score and Plan: 2 and Ondansetron, Dexamethasone and Scopolamine patch - Pre-op  Airway Management Planned: Nasal Cannula and Natural Airway  Additional Equipment:   Intra-op Plan:   Post-operative Plan:   Informed Consent: I have reviewed the patients History and Physical, chart, labs and discussed the procedure including the risks, benefits and alternatives for the proposed anesthesia with the patient or authorized representative who has indicated his/her understanding and acceptance.     Plan Discussed with: CRNA and Surgeon  Anesthesia Plan Comments:         Anesthesia Quick Evaluation

## 2017-03-13 NOTE — Op Note (Signed)
Pre-Operative Diagnosis: 1) 38+2-week intrauterine pregnancy 2) class III fetal tracing Postoperative Diagnosis: 1) 38+2-week intrauterine pregnancy 2) class III fetal tracing Procedure: Emergent low transverse cesarean section Surgeon: Dr. Waynard ReedsKendra Moesha Sarchet Assistant: None Operative Findings: Female infant in the vertex presentation with Apgar scores of 8 at 1 minute and 9 at 5 minutes.  Normal-appearing ovaries and tubes.  The infant weighed 3 pounds 15 ounces or 1790 g.  Given the birthweight less than 2000 g the baby was transferred to the NICU. The fetal vertex was deeply wedged into the pelvis and could not easily be elevated, therefore the decision was made to find the fetal feet and deliver the baby in the breech presentation.  The placenta appeared to have a velamentous cord insertion Specimen: Placenta to pathology EBL: Total I/O In: 2000 [I.V.:2000] Out: 620 [Urine:150; Blood:470]   Procedure:[name] is an 21 year old gravida 2 para 1001 at 3038 weeks and 2 days estimated gestational age who presents for cesarean section.  The patient was admitted in labor.  The patient was admitted with a class II tracing with fetal variables with contractions.  The tracing deteriorated to a class III tracing that did not respond to IV fluid bolus, oxygen, maternal position changes.  The patient's cervical exam was 8 cm.  Given the patient was remote from delivery the decision was made to proceed with an emergency cesarean section. Following the appropriate informed consent the patient was brought to the operating room where spinal anesthesia was administered and found to be adequate.  The patient was appropriately identified in the preoperative timeout procedure. She was placed in the dorsal supine position with a leftward tilt. She was prepped and draped with a Betadine splash. Scalpel was then used to make a Pfannenstiel skin incision which was carried down to the underlying layers of soft tissue to the  fascia. The fascia was incised in the midline and the fascial incision was extended laterally with blunt dissection.  The rectus muscles were separated in the midline and the abdominal peritoneum was identified and entered bluntly.  The incision was extended with blunt dissection.  The Alexis retractor was then deployed.  A scalpel was then used to make a low transverse incision on the uterus.  This incision was extended laterally with blunt dissection.  The fetal vertex was found to be wedged deep in the pelvis and could not be easily elevated to allow for delivery of the head.  Therefore, the decision was made to deliver the baby in the double footling breech presentation.  The baby cried on the operative field.  Due to the emergent nature of the delivery a 1 minute cord clamping delay was not performed.  The umbilical cord was immediately clamped and cut and the infant was passed to the waiting NICU team.  The placenta that was then spontaneously delivered and the uterus was cleared of all clot and debris.  The uterine incision was repaired with #1 chromic in a running locked fashion followed by second imbricating layer.  This incision was noted to be hemostatic.  Ovaries and tubes were inspected and found to be normal.  The Alexis retractor was removed.  The abdominal peritoneum was repaired with 2-0 Vicryl in a running fashion.  The rectus muscles were reapproximated with 2 oh fashion.  The fascia was closed with a looped PDS in a running fashion.  The subcuticular layer was closed with 2-0 plain gut interrupted sutures.  The skin was closed with 4-0 Vicryl in a subcuticular  fashion and Dermabond.  All sponge, lap, needle counts were correct.  The patient was transferred to the PACU in stable condition following the procedure

## 2017-03-13 NOTE — Plan of Care (Signed)
Patient progressing appropriately. Up to bathroom with some assistance. Foley Catheter removed.

## 2017-03-13 NOTE — Brief Op Note (Signed)
03/13/2017  4:57 AM  PATIENT:  Kristine Erickson  21 y.o. female  PRE-OPERATIVE DIAGNOSIS:  cesarean section fetal intolerance to labor  POST-OPERATIVE DIAGNOSIS:  cesarean section fetal intolerance to labor  PROCEDURE:  Procedure(s): CESAREAN SECTION (N/A)  SURGEON:  Surgeon(s) and Role:    Waynard Reeds* Sundiata Ferrick, MD - Primary  PHYSICIAN ASSISTANT:   ASSISTANTS: none   ANESTHESIA:   spinal  EBL:  470 mL   BLOOD ADMINISTERED:none  DRAINS: Urinary Catheter (Foley)   LOCAL MEDICATIONS USED:  NONE  SPECIMEN:  Source of Specimen:  placenta  DISPOSITION OF SPECIMEN:  PATHOLOGY  COUNTS:  YES  TOURNIQUET:  * No tourniquets in log *  DICTATION: .Dragon Dictation  PLAN OF CARE: Admit to inpatient   PATIENT DISPOSITION:  PACU - hemodynamically stable.   Delay start of Pharmacological VTE agent (>24hrs) due to surgical blood loss or risk of bleeding: not applicable

## 2017-03-13 NOTE — MAU Note (Signed)
Pt c/o u/c's q2 min apart for the past hour. Reports bloody mucous after voiding, Denies leaking of fluid. Positive fetal movement.

## 2017-03-13 NOTE — Transfer of Care (Signed)
Immediate Anesthesia Transfer of Care Note  Patient: Kristine Erickson [name]  Procedure(s) Performed: CESAREAN SECTION (N/A Abdomen)  Patient Location: PACU  Anesthesia Type:Spinal  Level of Consciousness: awake, alert  and oriented  Airway & Oxygen Therapy: Patient Spontanous Breathing  Post-op Assessment: Report given to RN and Post -op Vital signs reviewed and stable  Post vital signs: Reviewed and stable  Last Vitals:  Vitals:   03/13/17 0253 03/13/17 0258  SpO2: 100% (!) 81%    Last Pain: There were no vitals filed for this visit.       Complications: No apparent anesthesia complications

## 2017-03-13 NOTE — Lactation Note (Signed)
This note was copied from a baby's chart. Lactation Consultation Note  Patient Name: Kristine Erickson Date: 03/13/2017 Reason for consult: Initial assessment;NICU baby;Infant < 6lbs;Early term 37-38.6wks   Initial consult with mom of 25 hour old NICU infant. Mom reports she plans to BF.   Providing Milk for Your Baby in NICU given. Reviewed recommended pumping schedule, what to expect with pumping and storage of breast milk in the NICU infant.   Mom does not wish to begin pumping at this time as she would like to go and visit infant in the NICU and she has visitors in the room.   Pump and pump kit placed in room. Mom to call when she returns from NICU to begin pumping. Mom will also need to be taught hand expression at that time.   [facility] Brochure given, mom informed of IP/OP Services, BF Support Groups and [place] phone #.   Mom reports she has no questions at this time.      Maternal Data Formula Feeding for Exclusion: No Has patient been taught Hand Expression?: No Does the patient have breastfeeding experience prior to this delivery?: Yes  Feeding Feeding Type: Formula Nipple Type: Slow - flow Length of feed: 15 min  LATCH Score                   Interventions    Lactation Tools Discussed/Used WIC Program: No(Plans to apply)   Consult Status Consult Status: Follow-up Date: 03/14/17 Follow-up type: In-patient    Kristine Erickson Kristine Erickson 03/13/2017, 12:17 PM

## 2017-03-13 NOTE — Lactation Note (Signed)
This note was copied from a baby's chart. Lactation Consultation Note  Patient Name: Kristine Erickson VWUJW'JToday's Date: 03/13/2017 Reason for consult: Follow-up assessment;NICU baby;Infant < 6lbs   Follow up with mom . Mom reprots she has been able to BF infant today in the NICU.   Set up DEBP with instructions for use on Initiate setting. Assembling, disassembling and cleaning of pump parts demonstrated and explained.   Mom has room full of visitors and mom wants to go to NICU, mom still needs to be shown how to hand express.    Maternal Data    Feeding Feeding Type: Formula Nipple Type: Slow - flow Length of feed: 15 min  LATCH Score Latch: Grasps breast easily, tongue down, lips flanged, rhythmical sucking.  Audible Swallowing: A few with stimulation  Type of Nipple: Everted at rest and after stimulation  Comfort (Breast/Nipple): Soft / non-tender  Hold (Positioning): No assistance needed to correctly position infant at breast.  LATCH Score: 9  Interventions    Lactation Tools Discussed/Used WIC Program: No Pump Review: Setup, frequency, and cleaning;Milk Storage Initiated by:: Noralee StainSharon Governor Matos, RN, IBCLC Date initiated:: 03/13/17   Consult Status Consult Status: Follow-up Date: 03/14/17 Follow-up type: In-patient    Kristine Erickson 03/13/2017, 6:19 PM

## 2017-03-14 MED ORDER — RHO D IMMUNE GLOBULIN 1500 UNIT/2ML IJ SOSY
300.0000 ug | PREFILLED_SYRINGE | Freq: Once | INTRAMUSCULAR | Status: AC
  Administered 2017-03-14: 300 ug via INTRAVENOUS
  Filled 2017-03-14: qty 2

## 2017-03-14 NOTE — Progress Notes (Signed)
Patient is doing well.  She is tolerating PO, ambulating, voiding.  Pain is controlled.  Lochia is appropriate  Vitals:   03/13/17 2310 03/14/17 0100 03/14/17 0510 03/14/17 0851  BP:  (!) 98/43 (!) 95/52 (!) 105/59  Pulse:  85 82 83  Resp:  18 18 18   Temp:  98.7 F (37.1 C) 98.4 F (36.9 C) 98.3 F (36.8 C)  TempSrc:  Oral Oral Oral  SpO2: 95% 95% 100% 99%  Weight:      Height:        NAD Abdomen:  soft, appropriate tenderness, incisions intact and without erythema or significant drainage ext:    Symmetric,  Lab Results  Component Value Date   WBC 21.0 (H) 03/13/2017   HGB 12.6 03/13/2017   HCT 36.3 03/13/2017   MCV 91.4 03/13/2017   PLT 225 03/13/2017    --/--/AB NEG (12/27 0815)  A/P    21 y.o. G3P1011 POD 1 s/p s/p primary c/s for NRFS Routine post op and postpartum care.   Baby doing well in NICU (SGA) Rh neg--rhogam prior to discharge

## 2017-03-14 NOTE — Anesthesia Postprocedure Evaluation (Signed)
Anesthesia Post Note  Patient: Kristine Erickson  Procedure(s) Performed: CESAREAN SECTION (N/A Abdomen)     Patient location during evaluation: Mother Baby Anesthesia Type: Spinal Level of consciousness: awake Pain management: pain level controlled Vital Signs Assessment: post-procedure vital signs reviewed and stable Respiratory status: spontaneous breathing Cardiovascular status: stable Postop Assessment: spinal receding and patient able to bend at knees Anesthetic complications: no    Last Vitals:  Vitals:   03/14/17 0510 03/14/17 0851  BP: (!) 95/52 (!) 105/59  Pulse: 82 83  Resp: 18 18  Temp: 36.9 C 36.8 C  SpO2: 100% 99%    Last Pain:  Vitals:   03/14/17 0851  TempSrc: Oral  PainSc:    Pain Goal:                 Kristine Erickson,Kristine Erickson

## 2017-03-15 ENCOUNTER — Other Ambulatory Visit: Payer: Self-pay

## 2017-03-15 LAB — RH IG WORKUP (INCLUDES ABO/RH)
ABO/RH(D): AB NEG
Fetal Screen: NEGATIVE
Gestational Age(Wks): 38.3
UNIT DIVISION: 0
UNIT DIVISION: 0

## 2017-03-15 NOTE — Lactation Note (Signed)
This note was copied from a baby's chart. Lactation Consultation Note  Patient Name: Girl Tiajuana Marney Doctoratrick Today's Date: 03/15/2017   [facility] at 4:15 pm and she reports [facility]. Asked them if they could get mom a DEBP today and she said they had one with them. Attempted to visit with mom and she is not in her room at this time.      Maternal Data    Feeding Nipple Type: Other(green nipple)  LATCH Score                   Interventions    Lactation Tools Discussed/Used     Consult Status      Silas FloodSharon S Emalia Witkop 03/15/2017, 5:22 PM

## 2017-03-15 NOTE — Clinical Social Work Maternal (Signed)
CLINICAL SOCIAL WORK MATERNAL/CHILD NOTE  Patient Details  Name: Kristine Erickson MRN: [id number] Date of Birth: 1995/05/26  Date:  03/15/2017  Clinical Social Worker Initiating Note:  Ambrose Pancoast, LCSW Date/Time: Initiated:  03/14/17/1506     Child's Name:      Biological Parents:  Mother, Father   Need for Interpreter:  None   Reason for Referral:  Behavioral Health Concerns   Address:  [place] [address]    Phone number:  [phone] (home)     Additional phone number:  Household Members/Support Persons (HM/SP):   Household Member/Support Person 1   HM/SP Name Relationship DOB or Age  HM/SP -1 Kristine Erickson daughter 04/16/14  HM/SP -2        HM/SP -3        HM/SP -4        HM/SP -5        HM/SP -6        HM/SP -7        HM/SP -8          Natural Supports (not living in the home):  Friends(Kristine Erickson, owner of dtrs daycare.)   Professional Supports: None   Employment: Full-time   Type of Work: [employer]   Education:  High school graduate   Homebound arranged:    Museum/gallery curator Resources:  [employer]   Other Resources:  Physicist, medical , [employer] (MOB says her address is [place], [place], however due to mold issues she has been residing at [place].)   Cultural/Religious Considerations Which May Impact Care:  none identified.   Strengths:  Ability to meet basic needs , Home prepared for child    Psychotropic Medications:         Pediatrician:       Pediatrician List:   [facility]      Pediatrician Fax Number:    Risk Factors/Current Problems:      Cognitive State:  Alert , Goal Oriented    Mood/Affect:  Calm , Comfortable    CSW Assessment: LCSW met with MOB. When LCSW arrived MOB was bonding with baby as evidenced by MOB holding the baby.  LCSW explained CSW's role and  encouraged MOB to ask questions. MOB discussed that she experienced post partum depression with her first daughter and received therapy through the [employer] program. She stated that she was not interested in doing the [facility] program again because it required a lot of time. MOB stated that she felt that she could benefit from a regular therapist.  MOB stated that she has never taken psychotropic medications. She advised that her OB did prescribe medications for depression, however she was afraid to take them due to being pregnant.   MOB reported that infant's basic needs are currently met.  MOB indicated that her address is on [address], she has been residing in her daughter's home daycare located at [address] [place]. She states that daycare owner, Kristine Erickson, also resides in the home. MOB states that she is living in this arrangement because the apartment had an issue with leaking that she reported months ago. She stated that maintenance did not repair the leak and mold grew. MOB stated that she left the apartment and the utilities were turned off and now the apartment manager  is refusing to fix the apartment because no utilities.  She stated that she can stay with Kristine Erickson indefinitely.  MOB's infant is in the NICU due to low birth weight. She verbalized understanding of NICU policies and procedures.  LCSW provided education regarding the baby blues period vs. Perinatal mood disorders, discussed treatment and gave resources for mental health follow up if concerns arise. LCSW recommended self-evaluation during the postpartum time period using the New Mom Checklist form  Postpartum Progress and encouraged MOB to contact a medical professional if symptoms are noted at any time. LCSW assessed for safety and MOB denied SI, HI, DV.  MOB did not present with any acute signs and symptoms and appeared to have insight and awareness.  MOB agreed to seek help if help is needed.   There are  no identifiable barriers to discharge.     CSW Plan/Description:  No Further Intervention Required/No Barriers to Discharge    Ihor Gully, LCSW 03/15/2017, 1:16 PM

## 2017-03-15 NOTE — Progress Notes (Signed)
Discussed pt decision to breastfeed vs bottlefeed.  Pt states she has pumped intermittently & gotten nothing. Pt states she has latched infant in NICU. Discussed consistent DEBP use Q3hrs for 15-1720mins to stimulate breasts.  Pt states she will pump Q3hrs, plan put on whiteboard for reminders & encouragement.

## 2017-03-15 NOTE — Plan of Care (Signed)
Progressing appropriately. Visits baby in NICU.

## 2017-03-15 NOTE — Progress Notes (Signed)
POD#2 Pt is doing well. She has adequate pain control She does not desire early discharge. Baby still in NICU VSSAF IMP/ stable Plan/ Routine care.

## 2017-03-16 ENCOUNTER — Ambulatory Visit: Payer: Self-pay

## 2017-03-16 ENCOUNTER — Encounter (HOSPITAL_COMMUNITY): Payer: Self-pay | Admitting: *Deleted

## 2017-03-16 MED ORDER — OXYCODONE-ACETAMINOPHEN 5-325 MG PO TABS: 2.0000 | ORAL_TABLET | ORAL | 0 refills | Status: DC | PRN

## 2017-03-16 MED ORDER — IBUPROFEN 600 MG PO TABS: 600.0000 mg | ORAL_TABLET | Freq: Four times a day (QID) | ORAL | 0 refills | Status: DC

## 2017-03-16 NOTE — Discharge Summary (Signed)
Obstetric Discharge Summary Reason for Admission: onset of labor Prenatal Procedures: ultrasound Intrapartum Procedures: cesarean: low cervical, transverse Postpartum Procedures: none Complications-Operative and Postpartum: none Hemoglobin  Date Value Ref Range Status  03/13/2017 12.6 12.0 - 15.0 g/dL Final   HCT  Date Value Ref Range Status  03/13/2017 36.3 36.0 - 46.0 % Final    Physical Exam:  General: alert and cooperative Lochia: appropriate Uterine Fundus: firm  Discharge Diagnoses: Fetal intolerance to labor  Discharge Information: Date: 03/16/2017 Activity: pelvic rest Diet: routine Medications: PNV, Ibuprofen and Percocet Condition: stable Instructions: refer to practice specific booklet Discharge to: home Follow-up Information    Kristine Erickson, Kendra, MD. Schedule an appointment as soon as possible for a visit in 1 month(s).   Specialty:  Obstetrics and Gynecology Contact information: [address] [place] [id number] [phone]           Newborn Data: Live born female  Birth Weight: 3 lb 15.1 oz (1790 g) APGAR: 8, 9  Newborn Delivery   Birth date/time:  03/13/2017 04:02:00 Delivery type:  C-Section, Low Transverse C-section categorization:  Primary     Home with NICU.  Kristine Erickson 03/16/2017, 10:39 AM

## 2017-03-16 NOTE — Lactation Note (Signed)
This note was copied from a baby's chart. Lactation Consultation Note  Patient Name: Kristine Erickson UJWJX'BToday's Date: 03/16/2017 Reason for consult: Follow-up assessment;Infant < 6lbs;NICU baby  Pecola LeisureBaby is 3977 hours old in NICU  LC spoke with mom in Room 132 and she is for D/C today.  Per mom has pumped both breast 4 times in the last 24 with the most volume 6 ml.  LC encouraged mom to think of the pumping as a feeding and to increase to 8 times in 24 hours.  Per mom is pumping both breast at a time.  LC reviewed settings for the DEBP .  Sore nipple and engorgement prevention and tx reviewed.  LC enc mom to plan on pumping while visiting baby in NICU.  LC instructed mom on the pump paperwork and due to mom  Needing to obtain her $30.oo cash, LC provided the phone # to call.  Mother informed of post-discharge support and given phone number to the lactation department, including services for phone call assistance; out-patient appointments; and breastfeeding support group. List of other breastfeeding resources in the community given in the handout. Encouraged mother to call for problems or concerns related to breastfeeding.   Maternal Data    Feeding Feeding Type: Bottle Fed - Breast Milk Nipple Type: Slow - flow Length of feed: 30 min  LATCH Score                   Interventions Interventions: Breast feeding basics reviewed;DEBP(per mom has pumped x 4 in the last 24 hours with the most volume of 6 ml )  Lactation Tools Discussed/Used Pump Review: Setup, frequency, and cleaning;Milk Storage   Consult Status Consult Status: Follow-up Date: 03/16/17 Follow-up type: Other (comment)([facility], when she comes back to visit baby will bring $30.00 and call LC for [facility] loaner )    Kristine Erickson 03/16/2017, 9:53 AM

## 2017-03-16 NOTE — Lactation Note (Signed)
This note was copied from a baby's chart. Lactation Consultation Note  Patient Name: Girl Adelina Luisa Hartatrick ZOXWR'UToday's Date: 03/16/2017  mom returned at 1815 [facility].  [facility] for [facility] of a DEBP.  $30.00 cash obtained.  Per mom baby was Discharged today.  LC explained to mom since the baby  is less than 4 pounds at D/C to attempt to latch  And if she latches, limit time at the breast 15 -20 mins , and supplement.  If the baby is really sluggish feed the supplement 1st and then if wide awake latch.  Discussed Nutritive vs non -nutritive feeding pattern, and to watch for hanging out  Latched.  LC mentioned to mom the importance of feeding with cues, but also by 3 hours.  Mom receptive to [facility]after a week to allow the baby to grow.  LC stressed the importance of consistent pumping both breast for 15 -20 mins around The clock to protect milk supply.   [facility] 03/[facility]/17.  LC Pl[facility] a [facility] basket in epic.   Maternal Data    Feeding    LATCH Score                   Interventions    Lactation Tools Discussed/Used     Consult Status      Matilde SprangMargaret Ann Ashford [facility] 03/16/2017, 6:24 PM

## 2017-03-16 NOTE — Progress Notes (Signed)
POD#3 Pt doing well. Baby still in NICU VSSAF IMP/ Stable Plan/ Will discharge.

## 2018-04-26 ENCOUNTER — Ambulatory Visit (HOSPITAL_COMMUNITY)
Admission: EM | Admit: 2018-04-26 | Discharge: 2018-04-26 | Disposition: A | Discharge status: Home / Self Care | Payer: Medicaid Other

## 2018-04-26 ENCOUNTER — Encounter (HOSPITAL_COMMUNITY): Payer: Self-pay | Admitting: Emergency Medicine

## 2018-04-26 ENCOUNTER — Other Ambulatory Visit: Payer: Self-pay

## 2018-04-26 DIAGNOSIS — K0889 Other specified disorders of teeth and supporting structures: Secondary | ICD-10-CM

## 2018-04-26 MED ORDER — CLINDAMYCIN HCL 150 MG PO CAPS: 150.0000 mg | ORAL_CAPSULE | Freq: Four times a day (QID) | ORAL | 0 refills | Status: DC

## 2018-04-26 NOTE — Discharge Instructions (Addendum)
Please follow up with dental information provided

## 2018-04-26 NOTE — ED Provider Notes (Signed)
[facility]    CSN: [id number] Arrival date & time: 04/26/18  1308     History   Chief Complaint Chief Complaint  Patient presents with  . Dental Pain    HPI Kristine Erickson is a 23 y.o. female.   23 year old female presents with dental pain that she states is "abscess" x3 weeks.  Patient denies any trauma or known injury to the tooth.  Patient reports tenderness at the site and submandibular lymph nodes.  Patient denies fevers drainage upper respiratory illness nausea vomiting or diarrhea.  Patient is requesting pain medicine and states that she does not want Tylenol ibuprofen Aleve or any over-the-counter at this time.  Dates that she does not have a dentist.  Patient given resources in the community for which she can contact.     Past Medical History:  Diagnosis Date  . Chlamydia contact, treated   . Medical history non-contributory     Patient Active Problem List   Diagnosis Date Noted  . Labor and delivery, indication for care 03/13/2017  . Cesarean delivery, delivered, current hospitalization 03/13/2017  . Normal labor 04/16/2014  . Status post normal vaginal delivery 04/16/2014  . [redacted] weeks gestation of pregnancy   . Vaginal bleeding in pregnancy     Past Surgical History:  Procedure Laterality Date  . CESAREAN SECTION N/A 03/13/2017   Procedure: CESAREAN SECTION;  Surgeon: Waynard Reedsoss, Kendra, MD;  [facility];  Service: Obstetrics;  Laterality: N/A;  . NO PAST SURGERIES      OB History    Gravida  3   Para  1   Term  1   Preterm      AB  1   Living  1     SAB  1   TAB      Ectopic      Multiple  0   Live Births  1            Home Medications    Prior to Admission medications   Not on File    Family History History reviewed. No pertinent family history.  Social History Social History   Tobacco Use  . Smoking status: Current Every Day Smoker    Types: Cigarettes  . Smokeless tobacco: Never Used    Substance Use Topics  . Alcohol use: No  . Drug use: No     Allergies   Ibuprofen   Review of Systems Review of Systems  Constitutional: Negative for chills and fever.  HENT: Negative for ear pain and sore throat.        Toothache bottom right wisdom  Eyes: Negative for pain and visual disturbance.  Respiratory: Negative for cough and shortness of breath.   Cardiovascular: Negative for chest pain and palpitations.  Gastrointestinal: Negative for abdominal pain and vomiting.  Genitourinary: Negative for dysuria and hematuria.  Musculoskeletal: Negative for arthralgias and back pain.  Skin: Negative for color change and rash.  Neurological: Negative for seizures and syncope.  All other systems reviewed and are negative.    Physical Exam Triage Vital Signs ED Triage Vitals [04/26/18 1409]  Enc Vitals Group     BP 140/71     Pulse Rate 96     Resp 16     Temp 98.4 F (36.9 C)     Temp Source Oral     SpO2 100 %     Weight      Height      Head Circumference  Peak Flow      Pain Score 8     Pain Loc      Pain Edu?      Excl. in GC?    No data found.  Updated Vital Signs BP 140/71 (BP Location: Right Arm)   Pulse 96   Temp 98.4 F (36.9 C) (Oral)   Resp 16   LMP 04/26/2018 (Exact Date)   SpO2 100%   Visual Acuity Right Eye Distance:   Left Eye Distance:   Bilateral Distance:    Right Eye Near:   Left Eye Near:    Bilateral Near:     Physical Exam Vitals signs and nursing note reviewed.  Constitutional:      Appearance: She is well-developed.  HENT:     Head: Normocephalic and atraumatic.     Comments: Tenderness noted to left submandibular Eyes:     Conjunctiva/sclera: Conjunctivae normal.  Neck:     Musculoskeletal: Normal range of motion.  Pulmonary:     Effort: Pulmonary effort is normal.  Musculoskeletal: Normal range of motion.  Skin:    General: Skin is warm.  Neurological:     Mental Status: She is alert and oriented to  person, place, and time.      UC Treatments / Results  Labs (all labs ordered are listed, but only abnormal results are displayed) Labs Reviewed - No data to display  EKG None  Radiology No results found.  Procedures Procedures (including critical care time)  Medications Ordered in UC Medications - No data to display  Initial Impression / Assessment and Plan / UC Course  I have reviewed the triage vital signs and the nursing notes.  Pertinent labs & imaging results that were available during my care of the patient were reviewed by me and considered in my medical decision making (see chart for details).      Final Clinical Impressions(s) / UC Diagnoses   Final diagnoses:  None   Discharge Instructions   None    ED Prescriptions    None     Controlled Substance Prescriptions [place] Controlled Substance Registry consulted? Not Applicable   Alene Mires, NP 04/26/18 1422

## 2018-04-26 NOTE — ED Triage Notes (Signed)
The patient presented to the [facility] with a complaint of dental pain x 3 weeks that she believed to possibly be an abscess.

## 2019-01-07 ENCOUNTER — Emergency Department (HOSPITAL_COMMUNITY)
Admission: EM | Admit: 2019-01-07 | Discharge: 2019-01-08 | Disposition: A | Discharge status: Home / Self Care | Payer: Self-pay | Attending: Emergency Medicine | Admitting: Emergency Medicine

## 2019-01-07 ENCOUNTER — Other Ambulatory Visit: Payer: Self-pay

## 2019-01-07 ENCOUNTER — Encounter (HOSPITAL_COMMUNITY): Payer: Self-pay

## 2019-01-07 DIAGNOSIS — Z113 Encounter for screening for infections with a predominantly sexual mode of transmission: Secondary | ICD-10-CM

## 2019-01-07 DIAGNOSIS — R350 Frequency of micturition: Secondary | ICD-10-CM | POA: Insufficient documentation

## 2019-01-07 DIAGNOSIS — N3090 Cystitis, unspecified without hematuria: Secondary | ICD-10-CM

## 2019-01-07 DIAGNOSIS — R10815 Periumbilic abdominal tenderness: Secondary | ICD-10-CM | POA: Insufficient documentation

## 2019-01-07 DIAGNOSIS — N3000 Acute cystitis without hematuria: Secondary | ICD-10-CM | POA: Insufficient documentation

## 2019-01-07 DIAGNOSIS — Z202 Contact with and (suspected) exposure to infections with a predominantly sexual mode of transmission: Secondary | ICD-10-CM | POA: Insufficient documentation

## 2019-01-07 DIAGNOSIS — F17210 Nicotine dependence, cigarettes, uncomplicated: Secondary | ICD-10-CM | POA: Insufficient documentation

## 2019-01-07 LAB — URINALYSIS, ROUTINE W REFLEX MICROSCOPIC
Bilirubin Urine: NEGATIVE
Glucose, UA: NEGATIVE mg/dL
Hgb urine dipstick: NEGATIVE
Ketones, ur: NEGATIVE mg/dL
Nitrite: NEGATIVE
Protein, ur: NEGATIVE mg/dL
Specific Gravity, Urine: 1.017 (ref 1.005–1.030)
pH: 7 (ref 5.0–8.0)

## 2019-01-07 LAB — COMPREHENSIVE METABOLIC PANEL
ALT: 12 U/L (ref 0–44)
AST: 19 U/L (ref 15–41)
Albumin: 4 g/dL (ref 3.5–5.0)
Alkaline Phosphatase: 53 U/L (ref 38–126)
Anion gap: 9 (ref 5–15)
BUN: 7 mg/dL (ref 6–20)
CO2: 24 mmol/L (ref 22–32)
Calcium: 9.3 mg/dL (ref 8.9–10.3)
Chloride: 105 mmol/L (ref 98–111)
Creatinine, Ser: 0.78 mg/dL (ref 0.44–1.00)
GFR calc Af Amer: >60 mL/min (ref >60)
GFR calc non Af Amer: >60 mL/min (ref >60)
Glucose, Bld: 103 mg/dL — ABNORMAL HIGH (ref 70–99)
Potassium: 4.2 mmol/L (ref 3.5–5.1)
Sodium: 138 mmol/L (ref 135–145)
Total Bilirubin: 0.8 mg/dL (ref 0.3–1.2)
Total Protein: 7.4 g/dL (ref 6.5–8.1)

## 2019-01-07 LAB — CBC
HCT: 40.6 % (ref 36.0–46.0)
Hemoglobin: 13.3 g/dL (ref 12.0–15.0)
MCH: 32.8 pg (ref 26.0–34.0)
MCHC: 32.8 g/dL (ref 30.0–36.0)
MCV: 100 fL (ref 80.0–100.0)
Platelets: 239 10*3/uL (ref 150–400)
RBC: 4.06 MIL/uL (ref 3.87–5.11)
RDW: 12.6 % (ref 11.5–15.5)
WBC: 14.8 10*3/uL — ABNORMAL HIGH (ref 4.0–10.5)
nRBC: 0 % (ref 0.0–0.2)

## 2019-01-07 LAB — I-STAT BETA HCG BLOOD, ED (MC, WL, AP ONLY): I-stat hCG, quantitative: <5 m[IU]/mL (ref <5)

## 2019-01-07 LAB — LIPASE, BLOOD: Lipase: 24 U/L (ref 11–51)

## 2019-01-07 MED ORDER — SODIUM CHLORIDE 0.9% FLUSH
3.0000 mL | Freq: Once | INTRAVENOUS | Status: AC
  Administered 2019-01-08: 03:00:00 3 mL via INTRAVENOUS

## 2019-01-07 NOTE — ED Triage Notes (Signed)
Pt reports lower abd pain describes as "contractions" pt unsure if she is pregnant, LMP 10/7/. States she feels pressure in her rectum and vagina.

## 2019-01-08 LAB — WET PREP, GENITAL
Sperm: NONE SEEN
Trich, Wet Prep: NONE SEEN
Yeast Wet Prep HPF POC: NONE SEEN

## 2019-01-08 LAB — RAPID HIV SCREEN (HIV 1/2 AB+AG)
HIV 1/2 Antibodies: NONREACTIVE
HIV-1 P24 Antigen - HIV24: NONREACTIVE

## 2019-01-08 MED ORDER — CEPHALEXIN 500 MG PO CAPS: 500.0000 mg | ORAL_CAPSULE | Freq: Two times a day (BID) | ORAL | 0 refills | Status: DC

## 2019-01-08 MED ORDER — METRONIDAZOLE 500 MG PO TABS: 500.0000 mg | ORAL_TABLET | Freq: Two times a day (BID) | ORAL | 0 refills | Status: DC

## 2019-01-08 MED ORDER — SODIUM CHLORIDE 0.9 % IV SOLN
1.0000 g | Freq: Once | INTRAVENOUS | Status: AC
  Administered 2019-01-08: 1 g via INTRAVENOUS
  Filled 2019-01-08: qty 10

## 2019-01-08 NOTE — Discharge Instructions (Signed)
Your STD tests will result in the next 48 hours.  We will call you if they are positive and will call in additional antibiotics.  Your pain is thought to be coming from a UTI/bladder infection.  Please take antibiotics as directed.  You also have some clue cells on your vaginal swab.  This can indicate an infection called bacterial vaginosis.  You have been given an antibiotic for this as well.    Please take medications as directed.  Return to the ER or follow-up with your doctor for new or worsening symptoms.

## 2019-01-08 NOTE — ED Provider Notes (Signed)
[facility] EMERGENCY DEPARTMENT Provider Note   CSN: [id number] Arrival date & time: 01/07/19  1819     History   Chief Complaint Chief Complaint  Patient presents with  . Abdominal Pain    HPI Kristine Erickson is a 23 y.o. female.     Patient presents to the emergency department with a chief complaint of lower abdominal pain.  She reports pain with sitting.  She also reports some urinary frequency.  She states it feels like contractions.  She denies any vaginal discharge or bleeding.  Denies any known fevers, but is noted to be febrile in triage.  Denies any nausea or vomiting.  Denies any treatments prior to arrival.  The history is provided by the patient. No language interpreter was used.    Past Medical History:  Diagnosis Date  . Chlamydia contact, treated   . Medical history non-contributory     Patient Active Problem List   Diagnosis Date Noted  . Labor and delivery, indication for care 03/13/2017  . Cesarean delivery, delivered, current hospitalization 03/13/2017  . Normal labor 04/16/2014  . Status post normal vaginal delivery 04/16/2014  . [redacted] weeks gestation of pregnancy   . Vaginal bleeding in pregnancy     Past Surgical History:  Procedure Laterality Date  . CESAREAN SECTION N/A 03/13/2017   Procedure: CESAREAN SECTION;  Surgeon: Waynard Reeds, MD;  Location: [facility] BIRTHING SUITES;  Service: Obstetrics;  Laterality: N/A;  . NO PAST SURGERIES       OB History    Gravida  3   Para  1   Term  1   Preterm      AB  1   Living  1     SAB  1   TAB      Ectopic      Multiple  0   Live Births  1            Home Medications    Prior to Admission medications   Medication Sig Start Date End Date Taking? Authorizing Provider  clindamycin (CLEOCIN) 150 MG capsule Take 1 capsule (150 mg total) by mouth every 6 (six) hours. 04/26/18   Alene Mires, NP    Family History No family history on file.  Social  History Social History   Tobacco Use  . Smoking status: Current Every Day Smoker    Types: Cigarettes  . Smokeless tobacco: Never Used  Substance Use Topics  . Alcohol use: No  . Drug use: No     Allergies   Ibuprofen   Review of Systems Review of Systems  All other systems reviewed and are negative.    Physical Exam Updated Vital Signs BP 133/86 (BP Location: Right Arm)   Pulse (!) 110   Temp 99.2 F (37.3 C) (Oral)   Resp 16   LMP 12/24/2018   SpO2 100%   Physical Exam Vitals signs and nursing note reviewed.  Constitutional:      General: She is not in acute distress.    Appearance: She is well-developed.  HENT:     Head: Normocephalic and atraumatic.  Eyes:     Conjunctiva/sclera: Conjunctivae normal.  Neck:     Musculoskeletal: Neck supple.  Cardiovascular:     Rate and Rhythm: Regular rhythm. Tachycardia present.     Heart sounds: No murmur.  Pulmonary:     Effort: Pulmonary effort is normal. No respiratory distress.     Breath sounds: Normal breath sounds.  Abdominal:     Palpations: Abdomen is soft.     Tenderness: There is abdominal tenderness.     Comments: Suprapubic tenderness  Musculoskeletal: Normal range of motion.  Skin:    General: Skin is warm and dry.  Neurological:     Mental Status: She is alert and oriented to person, place, and time.  Psychiatric:        Mood and Affect: Mood normal.        Behavior: Behavior normal.      ED Treatments / Results  Labs (all labs ordered are listed, but only abnormal results are displayed) Labs Reviewed  COMPREHENSIVE METABOLIC PANEL - Abnormal; Notable for the following components:      Result Value   Glucose, Bld 103 (*)    All other components within normal limits  CBC - Abnormal; Notable for the following components:   WBC 14.8 (*)    All other components within normal limits  URINALYSIS, ROUTINE W REFLEX MICROSCOPIC - Abnormal; Notable for the following components:   Leukocytes,Ua  SMALL (*)    Bacteria, UA RARE (*)    All other components within normal limits  LIPASE, BLOOD  I-STAT BETA HCG BLOOD, ED ([facility], [facility], [facility] ONLY)    EKG None  Radiology No results found.  Procedures Procedures (including critical care time)  Medications Ordered in ED Medications  cefTRIAXone (ROCEPHIN) 1 g in sodium chloride 0.9 % 100 mL IVPB (1 g Intravenous New Bag/Given 01/08/19 0301)  sodium chloride flush (NS) 0.9 % injection 3 mL (3 mLs Intravenous Given 01/08/19 0301)     Initial Impression / Assessment and Plan / ED Course  I have reviewed the triage vital signs and the nursing notes.  Pertinent labs & imaging results that were available during my care of the patient were reviewed by me and considered in my medical decision making (see chart for details).        Patient with lower abdominal pain.  TTP over bladder.  UA consistent with infection.  Given loading dose of rocephin.  Will treat with keflex at home.    Wet prep notable for clue cells, will also give flagyl.  STD tests pending.  VSS.  Patient is non-toxic appearing and stable for outpatient follow-up.  Final Clinical Impressions(s) / ED Diagnoses   Final diagnoses:  Cystitis  Screen for STD (sexually transmitted disease)    ED Discharge Orders         Ordered    metroNIDAZOLE (FLAGYL) 500 MG tablet  2 times daily     01/08/19 0429    cephALEXin (KEFLEX) 500 MG capsule  2 times daily     01/08/19 0429           Montine Circle, PA-C 01/08/19 0431    Ripley Fraise, MD 01/08/19 0600

## 2019-01-09 LAB — GC/CHLAMYDIA PROBE AMP (~~LOC~~) NOT AT ARMC
Chlamydia: POSITIVE — AB
Neisseria Gonorrhea: POSITIVE — AB

## 2019-06-10 ENCOUNTER — Emergency Department (HOSPITAL_COMMUNITY)
Admission: EM | Admit: 2019-06-10 | Discharge: 2019-06-10 | Disposition: A | Discharge status: Home / Self Care | Payer: Self-pay | Attending: Emergency Medicine | Admitting: Emergency Medicine

## 2019-06-10 ENCOUNTER — Emergency Department (HOSPITAL_COMMUNITY): Payer: Self-pay

## 2019-06-10 ENCOUNTER — Encounter (HOSPITAL_COMMUNITY): Payer: Self-pay | Admitting: Emergency Medicine

## 2019-06-10 ENCOUNTER — Other Ambulatory Visit: Payer: Self-pay

## 2019-06-10 DIAGNOSIS — F17210 Nicotine dependence, cigarettes, uncomplicated: Secondary | ICD-10-CM | POA: Insufficient documentation

## 2019-06-10 DIAGNOSIS — Y999 Unspecified external cause status: Secondary | ICD-10-CM | POA: Insufficient documentation

## 2019-06-10 DIAGNOSIS — S43005A Unspecified dislocation of left shoulder joint, initial encounter: Secondary | ICD-10-CM | POA: Insufficient documentation

## 2019-06-10 DIAGNOSIS — X509XXA Other and unspecified overexertion or strenuous movements or postures, initial encounter: Secondary | ICD-10-CM | POA: Insufficient documentation

## 2019-06-10 DIAGNOSIS — Y9289 Other specified places as the place of occurrence of the external cause: Secondary | ICD-10-CM | POA: Insufficient documentation

## 2019-06-10 DIAGNOSIS — Y9389 Activity, other specified: Secondary | ICD-10-CM | POA: Insufficient documentation

## 2019-06-10 DIAGNOSIS — R52 Pain, unspecified: Secondary | ICD-10-CM

## 2019-06-10 MED ORDER — HYDROCODONE-ACETAMINOPHEN 5-325 MG PO TABS: 1.0000 | ORAL_TABLET | ORAL | 0 refills | Status: DC | PRN

## 2019-06-10 MED ORDER — PROPOFOL 10 MG/ML IV BOLUS
1.0000 mg/kg | Freq: Once | INTRAVENOUS | Status: AC
  Administered 2019-06-10: 69.4 mg via INTRAVENOUS
  Filled 2019-06-10: qty 20

## 2019-06-10 NOTE — Discharge Instructions (Signed)
Take the prescribed medication as directed.  Do not drive or drink alcohol while taking this medication. Wear shoulder sling and limit use of left arm until you follow-up with orthopedics. Follow-up with Dr. Valera Castle his office in the morning to arrange follow-up. Return to the ED for new or worsening symptoms.

## 2019-06-10 NOTE — ED Provider Notes (Signed)
Physical Exam  BP (!) 142/75   Pulse 96   Temp 98.5 F (36.9 C) (Oral)   Resp 15   Ht 5\' 3"  (1.6 m)   Wt 69.4 kg   LMP 06/09/2019   SpO2 100%   BMI 27.10 kg/m   Physical Exam Vitals and nursing note reviewed.  HENT:     Head: Atraumatic.  Cardiovascular:     Rate and Rhythm: Normal rate and regular rhythm.  Pulmonary:     Effort: Pulmonary effort is normal.     Breath sounds: Normal breath sounds.  Musculoskeletal:     Left shoulder: Deformity present. Decreased range of motion.  Neurological:     Mental Status: She is alert.     ED Course/Procedures     .Sedation  Date/Time: 06/10/2019 6:51 AM Performed by: 06/12/2019, MD Authorized by: Gilda Crease, MD   Consent:    Consent obtained:  Verbal   Consent given by:  Patient   Risks discussed:  Allergic reaction, dysrhythmia, inadequate sedation, nausea, prolonged hypoxia resulting in organ damage, prolonged sedation necessitating reversal, respiratory compromise necessitating ventilatory assistance and intubation and vomiting   Alternatives discussed:  Analgesia without sedation, anxiolysis and regional anesthesia Universal protocol:    Procedure explained and questions answered to patient or proxy's satisfaction: yes     Relevant documents present and verified: yes     Test results available and properly labeled: yes     Imaging studies available: yes     Required blood products, implants, devices, and special equipment available: yes     Site/side marked: yes     Immediately prior to procedure a time out was called: yes     Patient identity confirmation method:  Verbally with patient Indications:    Procedure necessitating sedation performed by:  Physician performing sedation Pre-sedation assessment:    Time since last food or drink:  4   ASA classification: class 1 - normal, healthy patient     Neck mobility: normal     Mouth opening:  3 or more finger widths   Thyromental distance:   4 finger widths   Mallampati score:  I - soft palate, uvula, fauces, pillars visible   Pre-sedation assessments completed and reviewed: airway patency, cardiovascular function, hydration status, mental status, nausea/vomiting, pain level, respiratory function and temperature   Immediate pre-procedure details:    Reassessment: Patient reassessed immediately prior to procedure     Reviewed: vital signs, relevant labs/tests and NPO status     Verified: bag valve mask available, emergency equipment available, intubation equipment available, IV patency confirmed, oxygen available and suction available   Procedure details (see MAR for exact dosages):    Preoxygenation:  Nasal cannula   Sedation:  Propofol   Intended level of sedation: deep   Intra-procedure monitoring:  Blood pressure monitoring, cardiac monitor, continuous pulse oximetry, frequent LOC assessments, frequent vital sign checks and continuous capnometry   Intra-procedure events: none     Total Provider sedation time (minutes):  15 Post-procedure details:    Attendance: Constant attendance by certified staff until patient recovered     Recovery: Patient returned to pre-procedure baseline     Post-sedation assessments completed and reviewed: airway patency, cardiovascular function, hydration status, mental status, nausea/vomiting, pain level, respiratory function and temperature     Patient is stable for discharge or admission: yes     Patient tolerance:  Tolerated well, no immediate complications    MDM  Postreduction x-ray shows successful reduction  without fracture.  Follow-up with orthopedics.       Orpah Greek, MD 06/10/19 [phone]
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
## 2020-01-11 ENCOUNTER — Ambulatory Visit (HOSPITAL_COMMUNITY)
Admission: RE | Admit: 2020-01-11 | Discharge: 2020-01-11 | Disposition: A | Discharge status: Home / Self Care | Payer: Self-pay | Source: Ambulatory Visit | Attending: Emergency Medicine | Admitting: Emergency Medicine

## 2020-01-11 ENCOUNTER — Other Ambulatory Visit: Payer: Self-pay

## 2020-01-11 ENCOUNTER — Encounter (HOSPITAL_COMMUNITY): Payer: Self-pay

## 2020-01-11 VITALS — BP 137/91 | HR 93 | Temp 99.3°F | Resp 18

## 2020-01-11 DIAGNOSIS — K0889 Other specified disorders of teeth and supporting structures: Secondary | ICD-10-CM

## 2020-01-11 MED ORDER — HYDROCODONE-ACETAMINOPHEN 5-325 MG PO TABS: 1.0000 | ORAL_TABLET | Freq: Four times a day (QID) | ORAL | 0 refills | Status: DC | PRN

## 2020-01-11 MED ORDER — IBUPROFEN 800 MG PO TABS: 800.0000 mg | ORAL_TABLET | Freq: Three times a day (TID) | ORAL | 0 refills | Status: DC

## 2020-01-11 MED ORDER — AMOXICILLIN-POT CLAVULANATE 875-125 MG PO TABS: 1.0000 | ORAL_TABLET | Freq: Two times a day (BID) | ORAL | 0 refills | Status: AC

## 2020-01-11 NOTE — ED Triage Notes (Signed)
Pt c/o right upper dental pain with possible abscess x 4 days. Pt states she has been taking ibuprofen and otc dental numbing gel.

## 2020-01-11 NOTE — ED Provider Notes (Signed)
[facility]    CSN: [id number] Arrival date & time: 01/11/20  1154      History   Chief Complaint Chief Complaint  Patient presents with  . Dental Pain  . Appointment    HPI Kristine Erickson is a 24 y.o. female significant past medical history presenting today for evaluation of dental pain.  Patient reports that she has had right upper dental pain for approximately 4 days.  Has developed swelling over the past 2 days with increased pain.  Using ibuprofen and over-the-counter topical numbing medicines without relief.  She reports having a broken tooth in the area.  Denies fevers.  Denies any difficulty swallowing or neck stiffness.  HPI  Past Medical History:  Diagnosis Date  . Chlamydia contact, treated   . Medical history non-contributory     Patient Active Problem List   Diagnosis Date Noted  . Labor and delivery, indication for care 03/13/2017  . Cesarean delivery, delivered, current hospitalization 03/13/2017  . Normal labor 04/16/2014  . Status post normal vaginal delivery 04/16/2014  . [redacted] weeks gestation of pregnancy   . Vaginal bleeding in pregnancy     Past Surgical History:  Procedure Laterality Date  . CESAREAN SECTION N/A 03/13/2017   Procedure: CESAREAN SECTION;  Surgeon: Waynard Reeds, MD;  Location: [facility] BIRTHING SUITES;  Service: Obstetrics;  Laterality: N/A;  . NO PAST SURGERIES      OB History    Gravida  3   Para  1   Term  1   Preterm      AB  1   Living  1     SAB  1   TAB      Ectopic      Multiple  0   Live Births  1            Home Medications    Prior to Admission medications   Medication Sig Start Date End Date Taking? Authorizing Provider  amoxicillin-clavulanate (AUGMENTIN) 875-125 MG tablet Take 1 tablet by mouth every 12 (twelve) hours for 7 days. 01/11/20 01/18/20  Parvin Stetzer C, PA-C  HYDROcodone-acetaminophen (NORCO/VICODIN) 5-325 MG tablet Take 1-2 tablets by mouth every 6 (six) hours as  needed for severe pain. 01/11/20   Waynette Towers C, PA-C  ibuprofen (ADVIL) 800 MG tablet Take 1 tablet (800 mg total) by mouth 3 (three) times daily. 01/11/20   Djon Tith, Junius Creamer, PA-C    Family History History reviewed. No pertinent family history.  Social History Social History   Tobacco Use  . Smoking status: Current Every Day Smoker    Types: Cigarettes  . Smokeless tobacco: Never Used  Substance Use Topics  . Alcohol use: No  . Drug use: No     Allergies   Ibuprofen and No known allergies   Review of Systems Review of Systems  Constitutional: Negative for activity change, appetite change, chills, fatigue and fever.  HENT: Positive for dental problem. Negative for congestion, ear pain, rhinorrhea, sinus pressure, sore throat and trouble swallowing.   Eyes: Negative for discharge and redness.  Respiratory: Negative for cough, chest tightness and shortness of breath.   Cardiovascular: Negative for chest pain.  Gastrointestinal: Negative for abdominal pain, diarrhea, nausea and vomiting.  Musculoskeletal: Negative for myalgias.  Skin: Negative for rash.  Neurological: Negative for dizziness, light-headedness and headaches.     Physical Exam Triage Vital Signs ED Triage Vitals  Enc Vitals Group     BP  Pulse      Resp      Temp      Temp src      SpO2      Weight      Height      Head Circumference      Peak Flow      Pain Score      Pain Loc      Pain Edu?      Excl. in GC?    No data found.  Updated Vital Signs BP (!) 137/91 (BP Location: Left Arm)   Pulse 93   Temp 99.3 F (37.4 C) (Oral)   Resp 18   LMP 12/19/2019   SpO2 100%   Breastfeeding No   Visual Acuity Right Eye Distance:   Left Eye Distance:   Bilateral Distance:    Right Eye Near:   Left Eye Near:    Bilateral Near:     Physical Exam Vitals and nursing note reviewed.  Constitutional:      Appearance: She is well-developed.     Comments: No acute distress  HENT:      Head: Normocephalic and atraumatic.     Comments: Very mild swelling noted to right upper jaw compared to left    Ears:     Comments: Bilateral ears without tenderness to palpation of external auricle, tragus and mastoid, EAC's without erythema or swelling, TM's with good bony landmarks and cone of light. Non erythematous.     Nose: Nose normal.     Mouth/Throat:     Comments: Broken molar noted posteriorly on right upper jaw, surrounding gingival swelling and tenderness No soft palate swelling, uvula midline, posterior pharynx patent Eyes:     Conjunctiva/sclera: Conjunctivae normal.  Neck:     Comments: No overlying neck swelling or erythema, full active range of motion of neck Cardiovascular:     Rate and Rhythm: Normal rate.  Pulmonary:     Effort: Pulmonary effort is normal. No respiratory distress.  Abdominal:     General: There is no distension.  Musculoskeletal:        General: Normal range of motion.     Cervical back: Neck supple.  Skin:    General: Skin is warm and dry.  Neurological:     Mental Status: She is alert and oriented to person, place, and time.      UC Treatments / Results  Labs (all labs ordered are listed, but only abnormal results are displayed) Labs Reviewed - No data to display  EKG   Radiology No results found.  Procedures Procedures (including critical care time)  Medications Ordered in UC Medications - No data to display  Initial Impression / Assessment and Plan / UC Course  I have reviewed the triage vital signs and the nursing notes.  Pertinent labs & imaging results that were available during my care of the patient were reviewed by me and considered in my medical decision making (see chart for details).     Dental infection-treated with Augmentin x1 week, continue anti-inflammatories, hydrocodone for severe pain and warm compresses.  No sign of deep space infection or Ludwig's angina at this time.  Continue to monitor,Discussed  strict return precautions. Patient verbalized understanding and is agreeable with plan.  Final Clinical Impressions(s) / UC Diagnoses   Final diagnoses:  Pain, dental     Discharge Instructions     Begin Augmentin twice daily for 1 week to treat infection Use anti-inflammatories for pain/swelling. You may  take up to 800 mg Ibuprofen every 8 hours with food. You may supplement Ibuprofen with Tylenol [phone] mg every 8 hours.  Hydrocodone for severe pain-do not drive or work after taking Warm compresses Follow-up if not improving or worsening    ED Prescriptions    Medication Sig Dispense Auth. Provider   amoxicillin-clavulanate (AUGMENTIN) 875-125 MG tablet Take 1 tablet by mouth every 12 (twelve) hours for 7 days. 14 tablet Charday Capetillo C, PA-C   ibuprofen (ADVIL) 800 MG tablet Take 1 tablet (800 mg total) by mouth 3 (three) times daily. 21 tablet Naeem Quillin C, PA-C   HYDROcodone-acetaminophen (NORCO/VICODIN) 5-325 MG tablet Take 1-2 tablets by mouth every 6 (six) hours as needed for severe pain. 8 tablet Tamatha Gadbois, [place] C, PA-C     I have reviewed the PDMP during this encounter.   Lew Dawes, PA-C 01/11/20 1246

## 2020-01-11 NOTE — Discharge Instructions (Signed)
Begin Augmentin twice daily for 1 week to treat infection Use anti-inflammatories for pain/swelling. You may take up to 800 mg Ibuprofen every 8 hours with food. You may supplement Ibuprofen with Tylenol [phone] mg every 8 hours.  Hydrocodone for severe pain-do not drive or work after taking Warm compresses Follow-up if not improving or worsening

## 2020-07-06 IMAGING — CR DG SHOULDER 2+V*L*
2 series · 2 of 2 positions shown · non-contrast
Comparison: None.

CLINICAL DATA: Injury

EXAM:
LEFT SHOULDER - 2+ VIEW

[w shoulder external left]
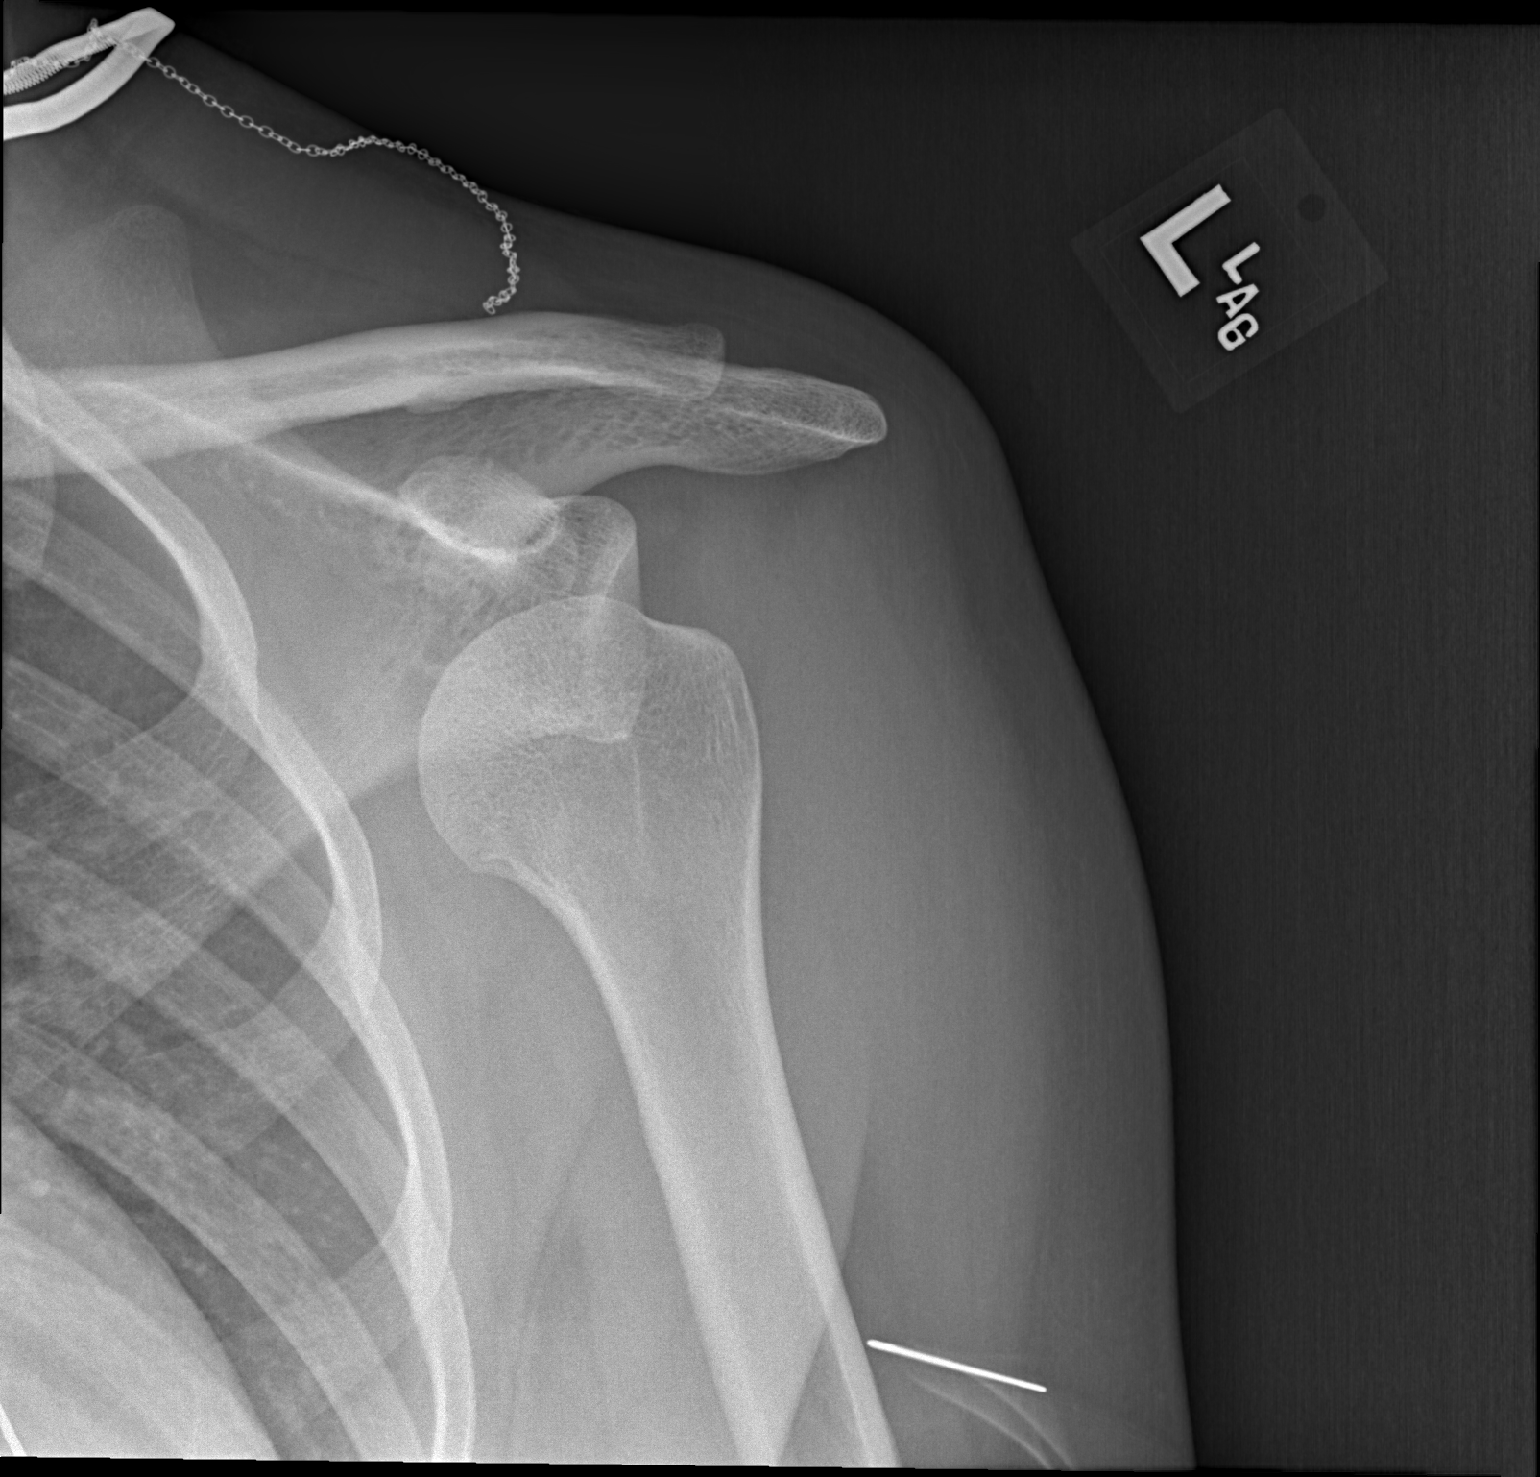

[w shoulder y-view left]
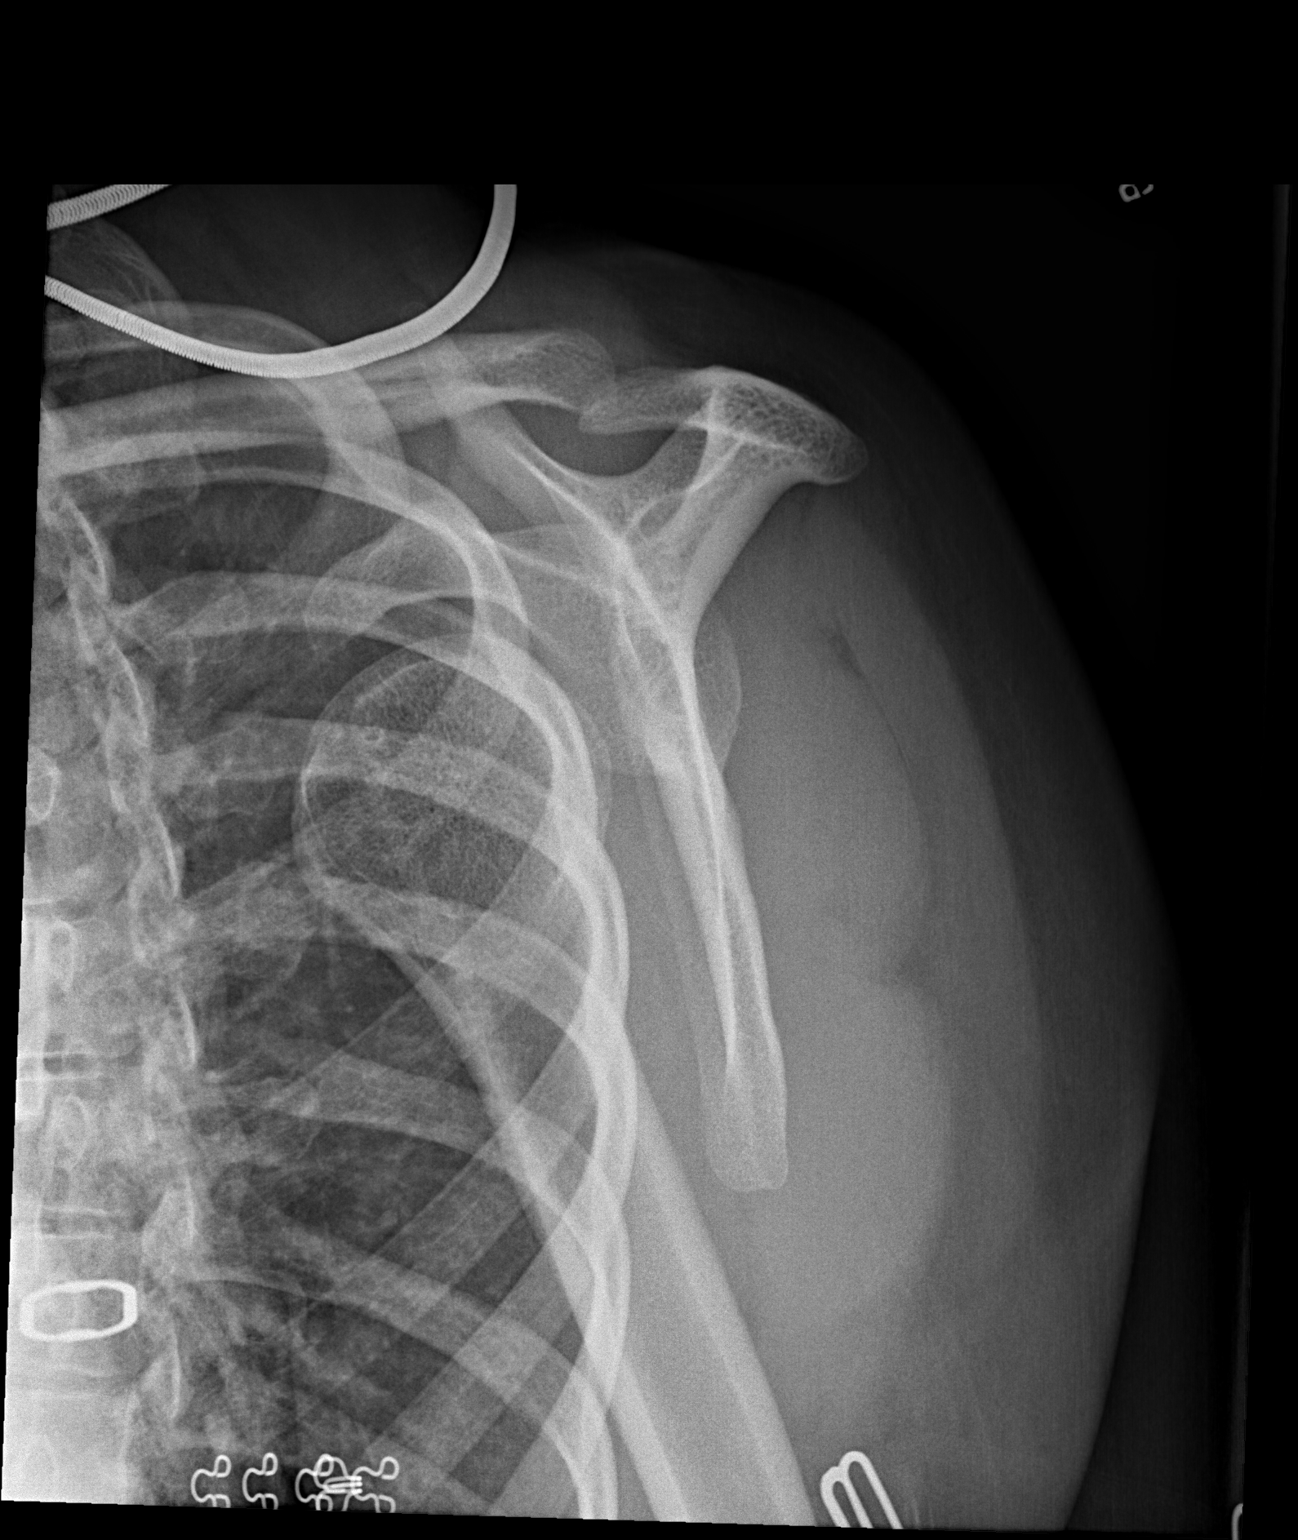

[2 of 2 positions shown; findings below may reference images not displayed]

FINDINGS: There is an anterior inferior dislocation of the left humeral head.
No osseous fracture seen. Overlying soft tissue swelling is seen.
IMPRESSION: Anteroinferior dislocation of the left humeral head. No definite
fracture.

## 2022-02-16 DIAGNOSIS — Z419 Encounter for procedure for purposes other than remedying health state, unspecified: Secondary | ICD-10-CM | POA: Diagnosis not present

## 2022-03-15 ENCOUNTER — Telehealth: Payer: Self-pay
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
## 2022-03-19 DIAGNOSIS — Z419 Encounter for procedure for purposes other than remedying health state, unspecified: Secondary | ICD-10-CM | POA: Diagnosis not present

## 2022-04-19 DIAGNOSIS — Z419 Encounter for procedure for purposes other than remedying health state, unspecified: Secondary | ICD-10-CM | POA: Diagnosis not present

## 2022-05-18 DIAGNOSIS — Z419 Encounter for procedure for purposes other than remedying health state, unspecified: Secondary | ICD-10-CM | POA: Diagnosis not present

## 2022-06-18 DIAGNOSIS — Z419 Encounter for procedure for purposes other than remedying health state, unspecified: Secondary | ICD-10-CM | POA: Diagnosis not present

## 2022-07-18 DIAGNOSIS — Z419 Encounter for procedure for purposes other than remedying health state, unspecified: Secondary | ICD-10-CM | POA: Diagnosis not present

## 2022-07-20 ENCOUNTER — Encounter (HOSPITAL_COMMUNITY): Payer: Self-pay | Admitting: Emergency Medicine

## 2022-07-20 ENCOUNTER — Ambulatory Visit (HOSPITAL_COMMUNITY)
Admission: EM | Admit: 2022-07-20 | Discharge: 2022-07-20 | Disposition: A | Discharge status: Home / Self Care | Payer: Medicaid Other | Attending: Physician Assistant | Admitting: Physician Assistant

## 2022-07-20 ENCOUNTER — Ambulatory Visit (INDEPENDENT_AMBULATORY_CARE_PROVIDER_SITE_OTHER): Payer: Medicaid Other

## 2022-07-20 DIAGNOSIS — S161XXA Strain of muscle, fascia and tendon at neck level, initial encounter: Secondary | ICD-10-CM

## 2022-07-20 DIAGNOSIS — S39012A Strain of muscle, fascia and tendon of lower back, initial encounter: Secondary | ICD-10-CM

## 2022-07-20 DIAGNOSIS — S99922A Unspecified injury of left foot, initial encounter: Secondary | ICD-10-CM | POA: Diagnosis not present

## 2022-07-20 DIAGNOSIS — S90222A Contusion of left lesser toe(s) with damage to nail, initial encounter: Secondary | ICD-10-CM

## 2022-07-20 DIAGNOSIS — Z041 Encounter for examination and observation following transport accident: Secondary | ICD-10-CM | POA: Diagnosis not present

## 2022-07-20 MED ORDER — NAPROXEN 500 MG PO TABS: 500.0000 mg | ORAL_TABLET | Freq: Two times a day (BID) | ORAL | 0 refills | Status: DC

## 2022-07-20 MED ORDER — CYCLOBENZAPRINE HCL 5 MG PO TABS: 5.0000 mg | ORAL_TABLET | Freq: Three times a day (TID) | ORAL | 0 refills | Status: DC | PRN
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
## 2022-07-20 NOTE — Discharge Instructions (Addendum)
Keep toenail dressing in place, change daily Wash with warm soapy water Declined removal of toenail Do not take muscle relaxer with alcohol or while driving as it will may you drowsy Apply ice and heat to neck and back 15 mintues 4 times per day Stretch neck and back daily
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
## 2023-06-24 ENCOUNTER — Other Ambulatory Visit: Payer: Self-pay | Admitting: Family Medicine

## 2023-06-24 ENCOUNTER — Encounter (HOSPITAL_COMMUNITY): Payer: Self-pay | Admitting: Obstetrics and Gynecology

## 2023-06-24 ENCOUNTER — Inpatient Hospital Stay (HOSPITAL_COMMUNITY)
Admission: AD | Admit: 2023-06-24 | Discharge: 2023-06-24 | Disposition: A | Discharge status: Home / Self Care | Attending: Obstetrics and Gynecology | Admitting: Obstetrics and Gynecology

## 2023-06-24 ENCOUNTER — Encounter: Payer: Self-pay | Admitting: Family Medicine

## 2023-06-24 DIAGNOSIS — Z3202 Encounter for pregnancy test, result negative: Secondary | ICD-10-CM | POA: Diagnosis not present

## 2023-06-24 DIAGNOSIS — R8290 Unspecified abnormal findings in urine: Secondary | ICD-10-CM

## 2023-06-24 DIAGNOSIS — R109 Unspecified abdominal pain: Secondary | ICD-10-CM | POA: Diagnosis present

## 2023-06-24 DIAGNOSIS — N3000 Acute cystitis without hematuria: Secondary | ICD-10-CM

## 2023-06-24 LAB — URINALYSIS, ROUTINE W REFLEX MICROSCOPIC
Bilirubin Urine: NEGATIVE
Glucose, UA: NEGATIVE mg/dL
Ketones, ur: NEGATIVE mg/dL
Nitrite: POSITIVE — AB
Protein, ur: NEGATIVE mg/dL
Specific Gravity, Urine: 1.019 (ref 1.005–1.030)
pH: 5 (ref 5.0–8.0)

## 2023-06-24 LAB — POCT PREGNANCY, URINE: Preg Test, Ur: NEGATIVE

## 2023-06-24 MED ORDER — SULFAMETHOXAZOLE-TRIMETHOPRIM 800-160 MG PO TABS: 1.0000 | ORAL_TABLET | Freq: Two times a day (BID) | ORAL | 0 refills | Status: AC

## 2023-06-24 NOTE — MAU Note (Addendum)
...  Kristine Erickson is a 28 y.o. at Unknown here in MAU reporting: Desires further evaluation to see if she is pregnant or not. She reports she feels and sees movement often in her abdomen. She reports today specifically she began experiencing lower abdominal pressure and has had an increase in urination and strong smelling urine. She reports "I don't think I am delusional or tripping or anything it's just what I am feeling." Negative pregnancy tests at home.  LMP: 06/08/2023 - reports it was lighter than usual. Reports a full period in February.   Onset of complaint: Couple of weeks Pain score: Denies pain, just pressure.  Vitals:   06/24/23 1616  BP: 126/81  Pulse: 94  Resp: 16  Temp: 98.4 F (36.9 C)  SpO2: 100%      Lab orders placed from triage: POCT Preg

## 2023-06-24 NOTE — MAU Provider Note (Signed)
 Event Date/Time   First Provider Initiated Contact with Patient 06/24/23 1650      S [name] is a 28 y.o. [id number] pregnant/non-pregnant female at Unknown who presents to MAU today with complaint of lower abdominal pressure and urinary symptoms. She has been feeling a "fluttery" sensation in her lower abdomen the past several days. Her urine is stronger smelling and darker than usual, and she is urinating more. She wonders if she might be pregnant. LMP 06/08/2023, negative pregnancy tests at home. Denies fever, chills, nausea, dysuria.  Not currently established with primary care or OBGYN.  Pertinent items noted in HPI and remainder of comprehensive ROS otherwise negative.   O BP 126/81 (BP Location: Right Arm)   Pulse 94   Temp 98.4 F (36.9 C) (Oral)   Resp 16   Ht 5\' 3"  (1.6 m)   Wt 61.4 kg   SpO2 100%   BMI 23.97 kg/m  Physical Exam Vitals reviewed.  Constitutional:      General: She is not in acute distress.    Appearance: She is well-developed. She is not diaphoretic.  Eyes:     General: No scleral icterus. Pulmonary:     Effort: Pulmonary effort is normal. No respiratory distress.  Skin:    General: Skin is warm and dry.  Neurological:     Mental Status: She is alert.     Coordination: Coordination normal.    Results for orders placed or performed during the hospital encounter of 06/24/23 (from the past 24 hours)  Pregnancy, urine POC     Status: None   Collection Time: 06/24/23  4:40 PM  Result Value Ref Range   Preg Test, Ur NEGATIVE NEGATIVE    MDM: MAU Course:  A 1. Abdominal pressure (Primary) - Discharge patient  2. Malodorous urine - Discharge patient  Medical screening exam complete  P Discharge from MAU in stable condition with pyelonephritis precautions. Follow up with PCP, patient states she plans on scheduling through MyChart. Provider will send MyChart message if urinalysis shows signs of UTI.  Future Appointments  Date  Time Provider Department Center  08/13/2023  3:40 PM Paseda, Baird Kay, FNP [facility] None   Allergies as of 06/24/2023       Reactions   Ibuprofen Itching   No Known Allergies         Medication List     TAKE these medications    cyclobenzaprine 5 MG tablet Commonly known as: FLEXERIL Take 1 tablet (5 mg total) by mouth 3 (three) times daily as needed for muscle spasms.   HYDROcodone-acetaminophen 5-325 MG tablet Commonly known as: NORCO/VICODIN Take 1-2 tablets by mouth every 6 (six) hours as needed for severe pain.   ibuprofen 800 MG tablet Commonly known as: ADVIL Take 1 tablet (800 mg total) by mouth 3 (three) times daily.   naproxen 500 MG tablet Commonly known as: NAPROSYN Take 1 tablet (500 mg total) by mouth 2 (two) times daily.        Melida Quitter, [place] 06/24/2023 5:02 PM   Melida Quitter, PA

## 2023-08-13 ENCOUNTER — Ambulatory Visit: Admitting: Nurse Practitioner

## 2023-09-12 ENCOUNTER — Ambulatory Visit (HOSPITAL_COMMUNITY)
Admission: EM | Admit: 2023-09-12 | Discharge: 2023-09-12 | Disposition: A | Discharge status: Home / Self Care | Attending: Family Medicine | Admitting: Family Medicine

## 2023-09-12 ENCOUNTER — Encounter (HOSPITAL_COMMUNITY): Payer: Self-pay | Admitting: Emergency Medicine

## 2023-09-12 ENCOUNTER — Other Ambulatory Visit: Payer: Self-pay

## 2023-09-12 DIAGNOSIS — Z87440 Personal history of urinary (tract) infections: Secondary | ICD-10-CM | POA: Insufficient documentation

## 2023-09-12 DIAGNOSIS — R102 Pelvic and perineal pain: Secondary | ICD-10-CM | POA: Insufficient documentation

## 2023-09-12 DIAGNOSIS — Z3202 Encounter for pregnancy test, result negative: Secondary | ICD-10-CM | POA: Diagnosis present

## 2023-09-12 DIAGNOSIS — R350 Frequency of micturition: Secondary | ICD-10-CM | POA: Diagnosis not present

## 2023-09-12 DIAGNOSIS — N3001 Acute cystitis with hematuria: Secondary | ICD-10-CM | POA: Insufficient documentation

## 2023-09-12 LAB — POCT URINALYSIS DIP (MANUAL ENTRY)
Bilirubin, UA: NEGATIVE
Glucose, UA: NEGATIVE mg/dL
Ketones, POC UA: NEGATIVE mg/dL
Nitrite, UA: NEGATIVE
Protein Ur, POC: 100 mg/dL — AB
Spec Grav, UA: >=1.03 — AB (ref 1.010–1.025)
Urobilinogen, UA: 0.2 U/dL
pH, UA: 5.5 (ref 5.0–8.0)

## 2023-09-12 LAB — POCT URINE PREGNANCY: Preg Test, Ur: NEGATIVE

## 2023-09-12 MED ORDER — CEPHALEXIN 500 MG PO CAPS: 500.0000 mg | ORAL_CAPSULE | Freq: Two times a day (BID) | ORAL | 0 refills | Status: AC

## 2023-09-12 NOTE — Discharge Instructions (Signed)

## 2023-09-12 NOTE — ED Provider Notes (Signed)
 [facility]    CSN: [id number] Arrival date & time: 09/12/23  0840      History   Chief Complaint No chief complaint on file.   HPI Kristine Erickson is a 28 y.o. female.   Kristine Erickson is a 28 y.o. female presenting for chief complaint of abdominal bloating and bilateral lower back pain with associated urinary frequency and urinary urgency that started approximately 1 week ago.  States her abdomen feels bloated randomly.  Last normal bowel movement was yesterday and was normal in consistency without blood/mucus in the stool.  Last menstrual cycle was August 29, 2023 (2 weeks ago).  She denies nausea, vomiting, diarrhea, vaginal symptoms, gross hematuria, flank pain, dizziness, fever, chills, headache, and recent cough/congestion.  She is passing gas.  History of cesarean section in 2018, no other history of abdominal surgeries.  History of UTIs. Denies recent antibiotic/steroid use. She has not attempted use of any OTC medications for symptoms PTA and denies frequent intake of urinary irritants.      Past Medical History:  Diagnosis Date   Chlamydia contact, treated    Medical history non-contributory     Patient Active Problem List   Diagnosis Date Noted   Labor and delivery, indication for care 03/13/2017   Cesarean delivery, delivered, current hospitalization 03/13/2017   Normal labor 04/16/2014   Status post normal vaginal delivery 04/16/2014   [redacted] weeks gestation of pregnancy    Vaginal bleeding in pregnancy     Past Surgical History:  Procedure Laterality Date   CESAREAN SECTION N/A 03/13/2017   Procedure: CESAREAN SECTION;  Surgeon: Okey Leader, MD;  Location: [facility]TH[facility] SU[facility];  Service: Obstetrics;  Laterality: N/A;   NO PAST SURGERIES      OB History     Gravida  3   Para  2   Term  2   Preterm      AB  1   Living  2      SAB  1   IAB      Ectopic      Multiple  0   Live Births  2            Home Medications     Prior to Admission medications   Medication Sig Start Date End Date Taking? Authorizing Provider  cephALEXin  (KEFLEX ) 500 MG capsule Take 1 capsule (500 mg total) by mouth 2 (two) times daily for 7 days. 09/12/23 09/19/23 Yes Jameer Storie, Dorna HERO, FNP  cyclobenzaprine  (FLEXERIL ) 5 MG tablet Take 1 tablet (5 mg total) by mouth 3 (three) times daily as needed for muscle spasms. 07/20/22   Juleen Rush, PA-C  HYDROcodone -acetaminophen  (NORCO/VICODIN) 5-325 MG tablet Take 1-2 tablets by mouth every 6 (six) hours as needed for severe pain. 01/11/20   Wieters, Hallie C, PA-C  ibuprofen  (ADVIL ) 800 MG tablet Take 1 tablet (800 mg total) by mouth 3 (three) times daily. 01/11/20   Wieters, Hallie C, PA-C  naproxen  (NAPROSYN ) 500 MG tablet Take 1 tablet (500 mg total) by mouth 2 (two) times daily. 07/20/22   Juleen Rush, PA-C    Family History History reviewed. No pertinent family history.  Social History Social History   Tobacco Use   Smoking status: Every Day    Types: Cigarettes   Smokeless tobacco: Never  Vaping Use   Vaping status: Never Used  Substance Use Topics   Alcohol use: Yes   Drug use: No     Allergies   Ibuprofen   and No known allergies   Review of Systems Review of Systems Per HPI  Physical Exam Triage Vital Signs ED Triage Vitals  Encounter Vitals Group     BP 09/12/23 0908 120/74     Girls Systolic BP Percentile --      Girls Diastolic BP Percentile --      Boys Systolic BP Percentile --      Boys Diastolic BP Percentile --      Pulse Rate 09/12/23 0908 96     Resp 09/12/23 0908 18     Temp 09/12/23 0908 98.8 F (37.1 C)     Temp Source 09/12/23 0908 Oral     SpO2 09/12/23 0908 100 %     Weight --      Height --      Head Circumference --      Peak Flow --      Pain Score 09/12/23 0904 8     Pain Loc --      Pain Education --      Exclude from Growth Chart --    No data found.  Updated Vital Signs BP 120/74 (BP Location: Left Arm)   Pulse 96    Temp 98.8 F (37.1 C) (Oral)   Resp 18   LMP 08/29/2023   SpO2 100%   Visual Acuity Right Eye Distance:   Left Eye Distance:   Bilateral Distance:    Right Eye Near:   Left Eye Near:    Bilateral Near:     Physical Exam Vitals and nursing note reviewed.  Constitutional:      Appearance: She is not ill-appearing or toxic-appearing.  HENT:     Head: Normocephalic and atraumatic.     Right Ear: Hearing and external ear normal.     Left Ear: Hearing and external ear normal.     Nose: Nose normal.     Mouth/Throat:     Lips: Pink.     Mouth: Mucous membranes are moist.     Pharynx: No posterior oropharyngeal erythema.   Eyes:     General: Lids are normal. Vision grossly intact. Gaze aligned appropriately.     Extraocular Movements: Extraocular movements intact.     Conjunctiva/sclera: Conjunctivae normal.    Cardiovascular:     Rate and Rhythm: Normal rate and regular rhythm.     Heart sounds: Normal heart sounds, S1 normal and S2 normal.  Pulmonary:     Effort: Pulmonary effort is normal. No respiratory distress.     Breath sounds: Normal breath sounds and air entry.  Abdominal:     General: Abdomen is flat. Bowel sounds are normal. There is no distension.     Palpations: Abdomen is soft. There is no mass.     Tenderness: There is no abdominal tenderness. There is no right CVA tenderness, left CVA tenderness, guarding or rebound.     Hernia: No hernia is present.     Comments: Nontender to abdominal exam.  No peritoneal signs.   Musculoskeletal:     Cervical back: Neck supple.   Skin:    General: Skin is warm and dry.     Capillary Refill: Capillary refill takes less than 2 seconds.     Findings: No rash.   Neurological:     General: No focal deficit present.     Mental Status: She is alert and oriented to person, place, and time. Mental status is at baseline.     Cranial Nerves: No dysarthria or facial  asymmetry.   Psychiatric:        Mood and Affect: Mood  normal.        Speech: Speech normal.        Behavior: Behavior normal.        Thought Content: Thought content normal.        Judgment: Judgment normal.      UC Treatments / Results  Labs (all labs ordered are listed, but only abnormal results are displayed) Labs Reviewed  POCT URINALYSIS DIP (MANUAL ENTRY) - Abnormal; Notable for the following components:      Result Value   Clarity, UA cloudy (*)    Spec Grav, UA >=1.030 (*)    Blood, UA trace-intact (*)    Protein Ur, POC =100 (*)    Leukocytes, UA Trace (*)    All other components within normal limits  URINE CULTURE  POCT URINE PREGNANCY    EKG   Radiology No results found.  Procedures Procedures (including critical care time)  Medications Ordered in UC Medications - No data to display  Initial Impression / Assessment and Plan / UC Course  I have reviewed the triage vital signs and the nursing notes.  Pertinent labs & imaging results that were available during my care of the patient were reviewed by me and considered in my medical decision making (see chart for details).   1.  Acute cystitis with hematuria, negative pregnancy test, suprapubic abdominal pain, urinary frequency Evaluation suggests acute cystitis based on presentation and urinalysis findings in clinic.  Urine culture pending.  Urine pregnancy is negative in clinic. Low suspicion for acute pyelonephritis, kidney stone or infected stone.  Keflex  antibiotic ordered. Appears well hydrated, therefore will defer labs/imaging.  Vitals stable.  Patient to push fluids to stay well hydrated and reduce intake of known urinary irritants.  Discussed methods of preventing future UTI.   Counseled patient on potential for adverse effects with medications prescribed/recommended today, strict ER and return-to-clinic precautions discussed, patient verbalized understanding.    Final Clinical Impressions(s) / UC Diagnoses   Final diagnoses:  Acute cystitis with  hematuria  Negative pregnancy test  Suprapubic abdominal pain  Urinary frequency     Discharge Instructions      Your urine shows you likely have a urinary tract infection.  I have sent your urine for culture to confirm this.  We will call you if we need to change your antibiotic when we find out the type of bacteria growing in your bladder.  Take antibiotic as directed with a snack/food to avoid stomach upset. To avoid GI upset please take this medication with food.   Avoid drinking beverages that irritate the urinary tract like sodas, tea, coffee, or juice. Drink plenty of water to stay well hydrated and prevent severe infection.  If you develop any new or worsening symptoms or if your symptoms do not start to improve, pleases return here or follow-up with your primary care provider. If your symptoms are severe, please go to the emergency room.     ED Prescriptions     Medication Sig Dispense Auth. Provider   cephALEXin  (KEFLEX ) 500 MG capsule Take 1 capsule (500 mg total) by mouth 2 (two) times daily for 7 days. 14 capsule Enedelia Dorna HERO, FNP      PDMP not reviewed this encounter.   Enedelia Dorna Pooler, [place] 09/12/23 [phone]

## 2023-09-12 NOTE — ED Triage Notes (Signed)
 Patient reports lower abdominal pain.  When lying back, patient points to an area on left side of abdomen, fullness or perceived bulging

## 2023-09-12 NOTE — ED Triage Notes (Signed)
 Patient reports for the past week has had abdominal pain, back pain and bloating.  Last bowel movement was yesterday.  Reports 2 BM's yesterday and reported as being normal.  Reports urinating more often.  Denies vaginal discharge.    Patient has not taken any medications for symptoms

## 2023-09-13 LAB — URINE CULTURE: Culture: <10000 — AB

## 2023-09-16 ENCOUNTER — Encounter: Payer: Self-pay | Admitting: Nurse Practitioner

## 2023-09-16 ENCOUNTER — Ambulatory Visit (INDEPENDENT_AMBULATORY_CARE_PROVIDER_SITE_OTHER): Admitting: Nurse Practitioner

## 2023-09-16 VITALS — BP 123/80 | HR 76 | Temp 98.1°F | Wt 128.2 lb

## 2023-09-16 DIAGNOSIS — Z8739 Personal history of other diseases of the musculoskeletal system and connective tissue: Secondary | ICD-10-CM | POA: Diagnosis not present

## 2023-09-16 DIAGNOSIS — Z1329 Encounter for screening for other suspected endocrine disorder: Secondary | ICD-10-CM | POA: Diagnosis not present

## 2023-09-16 DIAGNOSIS — F32A Depression, unspecified: Secondary | ICD-10-CM

## 2023-09-16 DIAGNOSIS — N3001 Acute cystitis with hematuria: Secondary | ICD-10-CM | POA: Diagnosis not present

## 2023-09-16 DIAGNOSIS — Z130 Encounter for screening for diseases of the blood and blood-forming organs and certain disorders involving the immune mechanism: Secondary | ICD-10-CM

## 2023-09-16 DIAGNOSIS — F1290 Cannabis use, unspecified, uncomplicated: Secondary | ICD-10-CM | POA: Insufficient documentation

## 2023-09-16 DIAGNOSIS — F419 Anxiety disorder, unspecified: Secondary | ICD-10-CM

## 2023-09-16 DIAGNOSIS — F1010 Alcohol abuse, uncomplicated: Secondary | ICD-10-CM | POA: Insufficient documentation

## 2023-09-16 DIAGNOSIS — F17200 Nicotine dependence, unspecified, uncomplicated: Secondary | ICD-10-CM | POA: Insufficient documentation

## 2023-09-16 DIAGNOSIS — Z13228 Encounter for screening for other metabolic disorders: Secondary | ICD-10-CM | POA: Diagnosis not present

## 2023-09-16 DIAGNOSIS — F1090 Alcohol use, unspecified, uncomplicated: Secondary | ICD-10-CM

## 2023-09-16 DIAGNOSIS — F17210 Nicotine dependence, cigarettes, uncomplicated: Secondary | ICD-10-CM

## 2023-09-16 DIAGNOSIS — Z1321 Encounter for screening for nutritional disorder: Secondary | ICD-10-CM

## 2023-09-16 NOTE — Progress Notes (Signed)
 New Patient Office Visit  Subjective:  Patient ID: Kristine Erickson, female    DOB: [birth year]  Age: 28 y.o. MRN: [id number]  CC:  Chief Complaint  Patient presents with   Establish Care    HPI Kristine Erickson is a 28 y.o. female  has a past medical history of Alcohol use disorder (09/16/2023), Anxiety, Chlamydia contact, treated, Depression, Marijuana use (09/16/2023), and Tobacco use disorder (09/16/2023).  Patient presents to establish care for her chronic medical conditions Has no recent PCP  Acute cystitis with hematuria patient was at the urgent care on 09/12/2023 with complaints of abdominal bloating and bilateral lower back pain with associated urinary frequency and urinary urgency.  She was prescribed Keflex  for UTI.  Still taking Keflex  she denies abdominal pain, urinary frequency, dysuria, abdominal bloating, frequency   History of left shoulder dislocation first happened in 2021, ever since then she has been having random left shoulder dislocation, currently denies pain.  Tobacco use disorder.  Smokes half pack of cigarettes daily started smoking at age 75.  Currently denies cough shortness of breath wheezing  Alcohol use disorder.  Drinks 2 store-bought alcoholic drinks daily, sometimes a bottle of alcoholic drink last her 2 days.  She denies abdominal pain nausea vomiting  Marijuana use.  Smokes marijuana every now and daily, denies use of other illicit drugs  Anxiety and depression.  She attributes this to life issues, anxiety and depression started January 2024 when she lost her father.  Never been on medication or counseling.  Currently denies SI, HI  Due for cervical cancer screening, Tdap vaccine, pneumococcal vaccine.  Plans for cervical cancer screening at next visit, need for Tdap vaccine and pneumococcal vaccine discussed     Past Medical History:  Diagnosis Date   Alcohol use disorder 09/16/2023   Anxiety    Chlamydia contact, treated    Depression     Marijuana use 09/16/2023   Tobacco use disorder 09/16/2023    Past Surgical History:  Procedure Laterality Date   CESAREAN SECTION N/A 03/13/2017   Procedure: CESAREAN SECTION;  Surgeon: Okey Leader, MD;  Location: [facility] SU[facility];  Service: Obstetrics;  Laterality: N/A;   NO PAST SURGERIES      Family History  Problem Relation Age of Onset   Asthma Mother     Social History   Socioeconomic History   Marital status: Single    Spouse name: Not on file   Number of children: 2   Years of education: Not on file   Highest education level: 12th grade  Occupational History   Not on file  Tobacco Use   Smoking status: Every Day    Current packs/day: 1.00    Average packs/day: 1 pack/day for 5.0 years (5.0 ttl pk-yrs)    Types: Cigarettes   Smokeless tobacco: Never  Vaping Use   Vaping status: Never Used  Substance and Sexual Activity   Alcohol use: Yes   Drug use: Yes    Types: Marijuana   Sexual activity: Yes    Birth control/protection: None  Other Topics Concern   Not on file  Social History Narrative   Lives with her children    Social Drivers of Health   Financial Resource Strain: Medium Risk (09/12/2023)   Overall Financial Resource Strain (CARDIA)    Difficulty of Paying Living Expenses: Somewhat hard  Food Insecurity: Food Insecurity Present (09/12/2023)   Hunger Vital Sign    Worried About Running Out of Food in the Last  Year: Sometimes true    Ran Out of Food in the Last Year: Sometimes true  Transportation Needs: No Transportation Needs (09/12/2023)   PRAPARE - Administrator, Civil Service (Medical): No    Lack of Transportation (Non-Medical): No  Physical Activity: Sufficiently Active (09/12/2023)   Exercise Vital Sign    Days of Exercise per Week: 5 days    Minutes of Exercise per Session: 130 min  Stress: Stress Concern Present (09/12/2023)   [employer] of Occupational Health - Occupational Stress Questionnaire    Feeling of  Stress: Rather much  Social Connections: Moderately Integrated (09/12/2023)   Social Connection and Isolation Panel    Frequency of Communication with Friends and Family: Twice a week    Frequency of Social Gatherings with Friends and Family: Twice a week    Attends Religious Services: 1 to 4 times per year    Active Member of [employer] or Organizations: No    Attends Engineer, structural: Not on file    Marital Status: Living with partner  Intimate Partner Violence: Not on file    ROS Review of Systems  Constitutional:  Negative for appetite change, chills, fatigue and fever.  HENT:  Negative for congestion, postnasal drip, rhinorrhea and sneezing.   Respiratory:  Negative for cough, shortness of breath and wheezing.   Cardiovascular:  Negative for chest pain, palpitations and leg swelling.  Gastrointestinal:  Negative for abdominal pain, constipation, nausea and vomiting.  Genitourinary:  Negative for difficulty urinating, dysuria, flank pain and frequency.  Musculoskeletal:  Negative for arthralgias, back pain, joint swelling and myalgias.  Skin:  Negative for color change, pallor, rash and wound.  Neurological:  Negative for dizziness, facial asymmetry, weakness, numbness and headaches.  Psychiatric/Behavioral:  Negative for behavioral problems, confusion, self-injury and suicidal ideas.     Objective:   Today's Vitals: BP 123/80   Pulse 76   Temp 98.1 F (36.7 C) (Oral)   Wt 128 lb 3.2 oz (58.2 kg)   LMP 08/29/2023   SpO2 100%   BMI 22.71 kg/m   Physical Exam Vitals and nursing note reviewed.  Constitutional:      General: She is not in acute distress.    Appearance: Normal appearance. She is not ill-appearing, toxic-appearing or diaphoretic.   Eyes:     General: No scleral icterus.       Right eye: No discharge.        Left eye: No discharge.     Extraocular Movements: Extraocular movements intact.     Conjunctiva/sclera: Conjunctivae normal.     Cardiovascular:     Rate and Rhythm: Normal rate and regular rhythm.     Pulses: Normal pulses.     Heart sounds: Normal heart sounds. No murmur heard.    No friction rub. No gallop.  Pulmonary:     Effort: Pulmonary effort is normal. No respiratory distress.     Breath sounds: Normal breath sounds. No stridor. No wheezing, rhonchi or rales.  Chest:     Chest wall: No tenderness.  Abdominal:     General: There is no distension.     Palpations: Abdomen is soft.     Tenderness: There is no abdominal tenderness. There is no right CVA tenderness, left CVA tenderness or guarding.   Musculoskeletal:        General: No swelling, tenderness, deformity or signs of injury.     Right lower leg: No edema.     Left lower leg:  No edema.   Skin:    General: Skin is warm and dry.     Capillary Refill: Capillary refill takes less than 2 seconds.     Coloration: Skin is not jaundiced or pale.     Findings: No bruising, erythema or lesion.   Neurological:     Mental Status: She is alert and oriented to person, place, and time.     Motor: No weakness.     Gait: Gait normal.   Psychiatric:        Mood and Affect: Mood normal.        Behavior: Behavior normal.        Thought Content: Thought content normal.        Judgment: Judgment normal.     Assessment & Plan:   Problem List Items Addressed This Visit       Genitourinary   Acute cystitis with hematuria   Advised to complete full course of antibiotics ordered        Other   Anxiety and depression   Flowsheet Row Office Visit from 09/16/2023 in [facility] Dept Of [facility]  PHQ-9 Total Score 6       09/16/2023    9:28 AM  GAD 7 : Generalized Anxiety Score  Nervous, Anxious, on Edge 0  Control/stop worrying 2  Worry too much - different things 2  Trouble relaxing 2  Restless 0  Easily annoyed or irritable 2  Afraid - awful might happen 0  Total GAD 7 Score 8  Anxiety  Difficulty Not difficult at all  Not interested in counseling or medication Currently denies SI, HI Follow-up in 4 weeks        History of dislocation of shoulder   Patient referred to orthopedics      Relevant Orders   Ambulatory referral to Orthopedic Surgery   Alcohol use disorder   Advised to limit to no more than 1 alcohol drink daily and that it is  better to avoid alcohol if possible      Tobacco use disorder   Smokes about 0.5  pack/day  Asked about quitting: confirms that she currently smokes cigarettes Advise to quit smoking: Educated about QUITTING to reduce the risk of cancer, cardio and cerebrovascular disease. Assess willingness: Unwilling to quit at this time, not working on cutting back. Assist with counseling and pharmacotherapy: Counseled for 5 minutes and literature provided. Arrange for follow up: follow up in 1 month and continue to offer help.       Marijuana use   Cessation encouraged      Other Visit Diagnoses       Screening for endocrine, nutritional, metabolic and immunity disorder    -  Primary   Relevant Orders   CBC   CMP14+EGFR   Hepatitis C antibody       Outpatient Encounter Medications as of 09/16/2023  Medication Sig   cephALEXin  (KEFLEX ) 500 MG capsule Take 1 capsule (500 mg total) by mouth 2 (two) times daily for 7 days.   [DISCONTINUED] cyclobenzaprine  (FLEXERIL ) 5 MG tablet Take 1 tablet (5 mg total) by mouth 3 (three) times daily as needed for muscle spasms. (Patient not taking: Reported on 09/16/2023)   [DISCONTINUED] HYDROcodone -acetaminophen  (NORCO/VICODIN) 5-325 MG tablet Take 1-2 tablets by mouth every 6 (six) hours as needed for severe pain. (Patient not taking: Reported on 09/16/2023)   [DISCONTINUED] ibuprofen  (ADVIL ) 800 MG tablet Take 1 tablet (800 mg total)  by mouth 3 (three) times daily. (Patient not taking: Reported on 09/16/2023)   [DISCONTINUED] naproxen  (NAPROSYN ) 500 MG tablet Take 1 tablet (500 mg total) by mouth 2  (two) times daily. (Patient not taking: Reported on 09/16/2023)   No facility-administered encounter medications on file as of 09/16/2023.    Follow-up: Return in about 4 weeks (around 10/14/2023) for CPE.   Taejon Irani R Ahmaad Neidhardt, FNP

## 2023-09-16 NOTE — Assessment & Plan Note (Signed)
Patient referred to orthopedics

## 2023-09-16 NOTE — Assessment & Plan Note (Signed)
 Advised to complete full course of antibiotics ordered

## 2023-09-16 NOTE — Patient Instructions (Signed)

## 2023-09-16 NOTE — Assessment & Plan Note (Signed)
 Smokes about 0.5  pack/day  Asked about quitting: confirms that she currently smokes cigarettes Advise to quit smoking: Educated about QUITTING to reduce the risk of cancer, cardio and cerebrovascular disease. Assess willingness: Unwilling to quit at this time, not working on cutting back. Assist with counseling and pharmacotherapy: Counseled for 5 minutes and literature provided. Arrange for follow up: follow up in 1 month and continue to offer help.

## 2023-09-16 NOTE — Assessment & Plan Note (Signed)
 Cessation encouraged

## 2023-09-16 NOTE — Assessment & Plan Note (Signed)
 Flowsheet Row Office Visit from 09/16/2023 in [facility] - A Dept Of [facility]  PHQ-9 Total Score 6       09/16/2023    9:28 AM  GAD 7 : Generalized Anxiety Score  Nervous, Anxious, on Edge 0  Control/stop worrying 2  Worry too much - different things 2  Trouble relaxing 2  Restless 0  Easily annoyed or irritable 2  Afraid - awful might happen 0  Total GAD 7 Score 8  Anxiety Difficulty Not difficult at all  Not interested in counseling or medication Currently denies SI, HI Follow-up in 4 weeks

## 2023-09-16 NOTE — Assessment & Plan Note (Signed)
 Advised to limit to no more than 1 alcohol drink daily and that it is  better to avoid alcohol if possible

## 2023-09-17 ENCOUNTER — Ambulatory Visit: Payer: Self-pay | Admitting: Nurse Practitioner

## 2023-09-17 LAB — CMP14+EGFR
ALT: 32 IU/L (ref 0–32)
AST: 49 IU/L — ABNORMAL HIGH (ref 0–40)
Albumin: 4.5 g/dL (ref 4.0–5.0)
Alkaline Phosphatase: 82 IU/L (ref 44–121)
BUN/Creatinine Ratio: 11 (ref 9–23)
BUN: 8 mg/dL (ref 6–20)
Bilirubin Total: 0.3 mg/dL (ref 0.0–1.2)
CO2: 22 mmol/L (ref 20–29)
Calcium: 9.6 mg/dL (ref 8.7–10.2)
Chloride: 102 mmol/L (ref 96–106)
Creatinine, Ser: 0.74 mg/dL (ref 0.57–1.00)
Globulin, Total: 2.9 g/dL (ref 1.5–4.5)
Glucose: 87 mg/dL (ref 70–99)
Potassium: 4.3 mmol/L (ref 3.5–5.2)
Sodium: 138 mmol/L (ref 134–144)
Total Protein: 7.4 g/dL (ref 6.0–8.5)
eGFR: 114 mL/min/{1.73_m2} (ref >59)

## 2023-09-17 LAB — CBC
Hematocrit: 44.6 % (ref 34.0–46.6)
Hemoglobin: 14.6 g/dL (ref 11.1–15.9)
MCH: 33.3 pg — ABNORMAL HIGH (ref 26.6–33.0)
MCHC: 32.7 g/dL (ref 31.5–35.7)
MCV: 102 fL — ABNORMAL HIGH (ref 79–97)
Platelets: 252 10*3/uL (ref 150–450)
RBC: 4.39 x10E6/uL (ref 3.77–5.28)
RDW: 12.4 % (ref 11.7–15.4)
WBC: 5.5 10*3/uL (ref 3.4–10.8)

## 2023-09-17 LAB — HEPATITIS C ANTIBODY: Hep C Virus Ab: NONREACTIVE

## 2023-10-04 ENCOUNTER — Ambulatory Visit: Admitting: Orthopaedic Surgery

## 2023-10-18 ENCOUNTER — Ambulatory Visit: Admitting: Orthopaedic Surgery

## 2023-10-18 ENCOUNTER — Other Ambulatory Visit (INDEPENDENT_AMBULATORY_CARE_PROVIDER_SITE_OTHER): Payer: Self-pay

## 2023-10-18 DIAGNOSIS — G8929 Other chronic pain: Secondary | ICD-10-CM | POA: Diagnosis not present

## 2023-10-18 DIAGNOSIS — M25512 Pain in left shoulder: Secondary | ICD-10-CM

## 2023-10-18 NOTE — Progress Notes (Signed)
 Office Visit Note   Patient: Kristine Erickson           Date of Birth: 07-17-1995           MRN: [id number] Visit Date: 10/18/2023              Requested by: Juanice Thomes SAUNDERS, FNP [phone] [address], [address] [place],  [place] [address] PCP: Paseda, Folashade R, FNP   Assessment & Plan: Visit Diagnoses:  1. Chronic left shoulder pain     Plan: History of Present Illness Kristine Erickson is a 28 year old female who presents with recurrent left shoulder dislocations.  She experiences frequent dislocations of the left shoulder during daily activities, such as putting on shirts and reaching for a seatbelt. The most recent dislocation occurred earlier today while reaching for her seatbelt. Dislocations also occur during sleep, particularly when lying on her arm overnight, and she has become accustomed to repositioning it herself. She has not undergone any prior imaging studies or received physical therapy for this issue. No current medication use for this condition.  Physical Exam CARDIOVASCULAR: Good pulse present. Musculoskeletal: Left shoulder girdle appears normal.  Axillary nerve intact.  Neurovascular intact distally.  Range of motion not tested.  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 Physical therapy unlikely beneficial. Surgical intervention likely necessary. - Order MRI of left shoulder to assess bone loss and structural issues. - Refer to shoulder specialist, Dr. Genelle or Dr. Addie, for evaluation and management.  Follow-Up Instructions: No follow-ups on file.   Orders:  Orders Placed This Encounter  Procedures   XR Shoulder Left   No orders of the defined types were placed in this encounter.     Procedures: No procedures performed   Clinical Data: No additional findings.   Subjective: Chief Complaint  Patient presents with   Left Shoulder - Pain    HPI  Review of  Systems  Constitutional: Negative.   HENT: Negative.    Eyes: Negative.   Respiratory: Negative.    Cardiovascular: Negative.   Endocrine: Negative.   Musculoskeletal: Negative.   Neurological: Negative.   Hematological: Negative.   Psychiatric/Behavioral: Negative.    All other systems reviewed and are negative.    Objective: Vital Signs: There were no vitals taken for this visit.  Physical Exam Vitals and nursing note reviewed.  Constitutional:      Appearance: She is well-developed.  HENT:     Head: Atraumatic.     Nose: Nose normal.  Eyes:     Extraocular Movements: Extraocular movements intact.  Cardiovascular:     Pulses: Normal pulses.  Pulmonary:     Effort: Pulmonary effort is normal.  Abdominal:     Palpations: Abdomen is soft.  Musculoskeletal:     Cervical back: Neck supple.  Skin:    General: Skin is warm.     Capillary Refill: Capillary refill takes less than 2 seconds.  Neurological:     Mental Status: She is alert. Mental status is at baseline.  Psychiatric:        Behavior: Behavior normal.        Thought Content: Thought content normal.        Judgment: Judgment normal.     Ortho Exam  Specialty Comments:  No specialty comments available.  Imaging: XR Shoulder Left Result Date: 10/18/2023 X-rays of the left shoulder show a reduced glenohumeral joint with slight anterior translation.  Deficiency of  anterior glenoid due to previous dislocations.      PMFS History: Patient Active Problem List   Diagnosis Date Noted   Anxiety and depression 09/16/2023   History of dislocation of shoulder 09/16/2023   Alcohol use disorder 09/16/2023   Tobacco use disorder 09/16/2023   Marijuana use 09/16/2023   Acute cystitis with hematuria 09/16/2023   Labor and delivery, indication for care 03/13/2017   Cesarean delivery, delivered, current hospitalization 03/13/2017   Normal labor 04/16/2014   Status post normal vaginal delivery 04/16/2014   [redacted]  weeks gestation of pregnancy    Vaginal bleeding in pregnancy    Past Medical History:  Diagnosis Date   Alcohol use disorder 09/16/2023   Anxiety    Chlamydia contact, treated    Depression    Marijuana use 09/16/2023   Tobacco use disorder 09/16/2023    Family History  Problem Relation Age of Onset   Asthma Mother     Past Surgical History:  Procedure Laterality Date   CESAREAN SECTION N/A 03/13/2017   Procedure: CESAREAN SECTION;  Surgeon: Okey Leader, MD;  Location: [facility] SU[facility];  Service: Obstetrics;  Laterality: N/A;   NO PAST SURGERIES     Social History   Occupational History   Not on file  Tobacco Use   Smoking status: Every Day    Current packs/day: 1.00    Average packs/day: 1 pack/day for 5.0 years (5.0 ttl pk-yrs)    Types: Cigarettes   Smokeless tobacco: Never  Vaping Use   Vaping status: Never Used  Substance and Sexual Activity   Alcohol use: Yes   Drug use: Yes    Types: Marijuana   Sexual activity: Yes    Birth control/protection: None

## 2023-10-18 NOTE — Addendum Note (Signed)
 Addended by: Daelin Haste on: 10/18/2023 01:20 PM   Modules accepted: Orders

## 2023-10-28 ENCOUNTER — Ambulatory Visit (INDEPENDENT_AMBULATORY_CARE_PROVIDER_SITE_OTHER): Payer: Self-pay | Admitting: Nurse Practitioner

## 2023-10-28 ENCOUNTER — Other Ambulatory Visit (HOSPITAL_COMMUNITY)
Admission: RE | Admit: 2023-10-28 | Discharge: 2023-10-28 | Disposition: A | Discharge status: Home / Self Care | Source: Ambulatory Visit | Attending: Nurse Practitioner | Admitting: Nurse Practitioner

## 2023-10-28 ENCOUNTER — Encounter: Payer: Self-pay | Admitting: Nurse Practitioner

## 2023-10-28 VITALS — BP 126/78 | HR 85 | Temp 97.0°F | Wt 125.0 lb

## 2023-10-28 DIAGNOSIS — G8929 Other chronic pain: Secondary | ICD-10-CM | POA: Insufficient documentation

## 2023-10-28 DIAGNOSIS — Z124 Encounter for screening for malignant neoplasm of cervix: Secondary | ICD-10-CM | POA: Insufficient documentation

## 2023-10-28 DIAGNOSIS — M25512 Pain in left shoulder: Secondary | ICD-10-CM

## 2023-10-28 DIAGNOSIS — R051 Acute cough: Secondary | ICD-10-CM | POA: Insufficient documentation

## 2023-10-28 DIAGNOSIS — H6122 Impacted cerumen, left ear: Secondary | ICD-10-CM | POA: Insufficient documentation

## 2023-10-28 DIAGNOSIS — Z0000 Encounter for general adult medical examination without abnormal findings: Secondary | ICD-10-CM | POA: Diagnosis not present

## 2023-10-28 DIAGNOSIS — F1290 Cannabis use, unspecified, uncomplicated: Secondary | ICD-10-CM

## 2023-10-28 DIAGNOSIS — F17200 Nicotine dependence, unspecified, uncomplicated: Secondary | ICD-10-CM | POA: Diagnosis not present

## 2023-10-28 DIAGNOSIS — R748 Abnormal levels of other serum enzymes: Secondary | ICD-10-CM | POA: Insufficient documentation

## 2023-10-28 DIAGNOSIS — Z13220 Encounter for screening for lipoid disorders: Secondary | ICD-10-CM

## 2023-10-28 DIAGNOSIS — H538 Other visual disturbances: Secondary | ICD-10-CM | POA: Insufficient documentation

## 2023-10-28 DIAGNOSIS — F17210 Nicotine dependence, cigarettes, uncomplicated: Secondary | ICD-10-CM

## 2023-10-28 DIAGNOSIS — F419 Anxiety disorder, unspecified: Secondary | ICD-10-CM

## 2023-10-28 DIAGNOSIS — F1090 Alcohol use, unspecified, uncomplicated: Secondary | ICD-10-CM | POA: Diagnosis not present

## 2023-10-28 DIAGNOSIS — N898 Other specified noninflammatory disorders of vagina: Secondary | ICD-10-CM

## 2023-10-28 DIAGNOSIS — F32A Depression, unspecified: Secondary | ICD-10-CM

## 2023-10-28 DIAGNOSIS — Z560 Unemployment, unspecified: Secondary | ICD-10-CM | POA: Insufficient documentation

## 2023-10-28 LAB — POC COVID19 BINAXNOW: SARS Coronavirus 2 Ag: NEGATIVE

## 2023-10-28 LAB — POCT INFLUENZA A/B
Influenza A, POC: NEGATIVE
Influenza B, POC: NEGATIVE

## 2023-10-28 MED ORDER — BENZONATATE 100 MG PO CAPS: 100.0000 mg | ORAL_CAPSULE | Freq: Three times a day (TID) | ORAL | 0 refills | Status: DC | PRN

## 2023-10-28 MED ORDER — DEBROX 6.5 % OT SOLN: 5.0000 [drp] | Freq: Two times a day (BID) | OTIC | 0 refills | Status: DC

## 2023-10-28 NOTE — Assessment & Plan Note (Addendum)
 Patient complains of cough that started 2 days ago She denies sneezing, stuffy nose, runny nose, no known sick contact.  Has tried over-the-counter cough medication without relief Test negative for flu and COVID Tessalon  Perles 100 mg 3 times daily as needed ordered encouraged to maintain hydration

## 2023-10-28 NOTE — Assessment & Plan Note (Signed)
  Intermittent blurred vision in left eye.  - Refer to eye specialist i

## 2023-10-28 NOTE — Assessment & Plan Note (Signed)
 Smokes about 0.5  pack/day   Asked about quitting: confirms that she currently smokes cigarettes Advise to quit smoking: Educated about QUITTING to reduce the risk of cancer, cardio and cerebrovascular disease. Assess willingness: Unwilling to quit at this time, not working on cutting back. Assist with counseling and pharmacotherapy: Counseled for 5 minutes and literature provided. Arrange for follow up: follow up in 11 month and continue to offer help.

## 2023-10-28 NOTE — Addendum Note (Signed)
 Addended by: VICTORY IHA on: 10/28/2023 04:10 PM   Modules accepted: Orders

## 2023-10-28 NOTE — Assessment & Plan Note (Signed)
  Shoulder pain, under evaluation Scheduled MRI on August 27th for further evaluation. Under orthopedic care. Continue Tylenol  as needed

## 2023-10-28 NOTE — Assessment & Plan Note (Signed)
 Debrox earwax removal solution ordered

## 2023-10-28 NOTE — Assessment & Plan Note (Signed)
    10/28/2023    2:29 PM 09/16/2023    9:27 AM  Depression screen PHQ 2/9  Decreased Interest 0 2  Down, Depressed, Hopeless 1 1  PHQ - 2 Score 1 3  Altered sleeping 3 1  Tired, decreased energy 2 1  Change in appetite 1 1  Feeling bad or failure about yourself  0 0  Trouble concentrating 0 0  Moving slowly or fidgety/restless 0 0  Suicidal thoughts 0 0  PHQ-9 Score 7 6  Difficult doing work/chores Not difficult at all Somewhat difficult       10/28/2023    2:32 PM 09/16/2023    9:28 AM  GAD 7 : Generalized Anxiety Score  Nervous, Anxious, on Edge 0 0  Control/stop worrying 0 2  Worry too much - different things 1 2  Trouble relaxing 1 2  Restless 0 0  Easily annoyed or irritable 1 2  Afraid - awful might happen 0 0  Total GAD 7 Score 3 8  Anxiety Difficulty Not difficult at all Not difficult at all    Depression and anxiety Increased anxiety and depression due to unemployment and financial stress. No self-harm thoughts. Declines medication. - Refer to Child psychotherapist for job placement assistance.
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
## 2023-10-28 NOTE — Assessment & Plan Note (Signed)
 Annual exam as documented.  Counseling done include healthy lifestyle involving committing to 150 minutes of exercise per week, heart healthy diet,The importance of adequate sleep also discussed.  Regular use of seat belt and home safety were also discussed . Changes in health habits are decided on by patient with goals and time frames set for achieving them. Immunization and cancer screening  needs are specifically addressed at this visit.    Encouraged to get Tdap vaccine, HPV vaccine and pneumococcal vaccine at the pharmacy

## 2023-10-28 NOTE — Patient Instructions (Addendum)
 OK to use Debrox (peroxide) in the ear to loosen up wax.  Do not use Q-tips as this can impact wax further.     Blurry vision, left eye  - Ambulatory referral to Ophthalmology  . Impacted cerumen, left ear  - carbamide peroxide (DEBROX) 6.5 % OTIC solution; Place 5 drops into both ears 2 (two) times daily.  Dispense: 15 mL; Refill: 0   Acute cough  - benzonatate  (TESSALON  PERLES) 100 MG capsule; Take 1 capsule (100 mg total) by mouth 3 (three) times daily as needed for cough.  Dispense: 20 capsule; Refill: 0  It is important that you exercise regularly at least 30 minutes 5 times a week as tolerated  Think about what you will eat, plan ahead. Choose  clean, green, fresh or frozen over canned, processed or packaged foods which are more sugary, salty and fatty. 70 to 75% of food eaten should be vegetables and fruit. Three meals at set times with snacks allowed between meals, but they must be fruit or vegetables. Aim to eat over a 12 hour period , example 7 am to 7 pm, and STOP after  your last meal of the day. Drink water,generally about 64 ounces per day, no other drink is as healthy. Fruit juice is best enjoyed in a healthy way, by EATING the fruit.  Thanks for choosing [facility] we consider it a privelige to serve you.

## 2023-10-28 NOTE — Assessment & Plan Note (Signed)
 Lab Results  Component Value Date   ALT 32 09/16/2023   AST 49 (H) 09/16/2023   ALKPHOS 82 09/16/2023   BILITOT 0.3 09/16/2023   Advised one drink per day for women, abstinence preferable. - Recheck liver enzymes during next lab work.

## 2023-10-28 NOTE — Assessment & Plan Note (Signed)
 Cessation encouraged

## 2023-10-30 ENCOUNTER — Ambulatory Visit: Payer: Self-pay | Admitting: Nurse Practitioner

## 2023-10-30 ENCOUNTER — Other Ambulatory Visit: Payer: Self-pay

## 2023-10-30 DIAGNOSIS — B9689 Other specified bacterial agents as the cause of diseases classified elsewhere: Secondary | ICD-10-CM

## 2023-10-30 LAB — NUSWAB VAGINITIS PLUS (VG+)
Atopobium vaginae: HIGH {score} — AB
Candida albicans, NAA: NEGATIVE
Candida glabrata, NAA: NEGATIVE
Chlamydia trachomatis, NAA: NEGATIVE
Megasphaera 1: HIGH {score} — AB
Neisseria gonorrhoeae, NAA: NEGATIVE
Trich vag by NAA: NEGATIVE

## 2023-10-30 MED ORDER — METRONIDAZOLE 500 MG PO TABS: 500.0000 mg | ORAL_TABLET | Freq: Two times a day (BID) | ORAL | 0 refills | Status: AC

## 2023-10-30 NOTE — Addendum Note (Signed)
 Addended by: JUANICE THOMES SAUNDERS on: 10/30/2023 07:13 PM   Modules accepted: Orders

## 2023-11-01 ENCOUNTER — Other Ambulatory Visit: Payer: Self-pay

## 2023-11-05 ENCOUNTER — Encounter: Payer: Self-pay | Admitting: Orthopaedic Surgery

## 2023-11-06 ENCOUNTER — Other Ambulatory Visit

## 2023-11-12 LAB — CYTOLOGY - PAP: Diagnosis: NEGATIVE

## 2023-11-13 ENCOUNTER — Encounter (HOSPITAL_BASED_OUTPATIENT_CLINIC_OR_DEPARTMENT_OTHER): Admitting: Orthopaedic Surgery

## 2023-11-13 ENCOUNTER — Encounter (HOSPITAL_BASED_OUTPATIENT_CLINIC_OR_DEPARTMENT_OTHER): Payer: Self-pay

## 2024-01-20 ENCOUNTER — Encounter: Payer: Self-pay | Admitting: Radiology

## 2024-02-29 ENCOUNTER — Emergency Department (HOSPITAL_COMMUNITY)
Admission: EM | Admit: 2024-02-29 | Discharge: 2024-02-29 | Disposition: A | Discharge status: Home / Self Care | Attending: Emergency Medicine | Admitting: Emergency Medicine

## 2024-02-29 ENCOUNTER — Emergency Department (HOSPITAL_COMMUNITY)

## 2024-02-29 DIAGNOSIS — Z23 Encounter for immunization: Secondary | ICD-10-CM | POA: Insufficient documentation

## 2024-02-29 DIAGNOSIS — S60511A Abrasion of right hand, initial encounter: Secondary | ICD-10-CM | POA: Insufficient documentation

## 2024-02-29 DIAGNOSIS — S0083XA Contusion of other part of head, initial encounter: Secondary | ICD-10-CM | POA: Insufficient documentation

## 2024-02-29 DIAGNOSIS — S025XXA Fracture of tooth (traumatic), initial encounter for closed fracture: Secondary | ICD-10-CM | POA: Insufficient documentation

## 2024-02-29 DIAGNOSIS — M25511 Pain in right shoulder: Secondary | ICD-10-CM | POA: Insufficient documentation

## 2024-02-29 DIAGNOSIS — M7989 Other specified soft tissue disorders: Secondary | ICD-10-CM

## 2024-02-29 DIAGNOSIS — M25512 Pain in left shoulder: Secondary | ICD-10-CM | POA: Insufficient documentation

## 2024-02-29 DIAGNOSIS — M25561 Pain in right knee: Secondary | ICD-10-CM | POA: Insufficient documentation

## 2024-02-29 DIAGNOSIS — M25562 Pain in left knee: Secondary | ICD-10-CM | POA: Insufficient documentation

## 2024-02-29 DIAGNOSIS — S00531A Contusion of lip, initial encounter: Secondary | ICD-10-CM | POA: Insufficient documentation

## 2024-02-29 DIAGNOSIS — S50311A Abrasion of right elbow, initial encounter: Secondary | ICD-10-CM | POA: Insufficient documentation

## 2024-02-29 DIAGNOSIS — T07XXXA Unspecified multiple injuries, initial encounter: Secondary | ICD-10-CM

## 2024-02-29 DIAGNOSIS — S0011XA Contusion of right eyelid and periocular area, initial encounter: Secondary | ICD-10-CM | POA: Insufficient documentation

## 2024-02-29 LAB — URINALYSIS, ROUTINE W REFLEX MICROSCOPIC
Bacteria, UA: NONE SEEN
Bilirubin Urine: NEGATIVE
Glucose, UA: NEGATIVE mg/dL
Ketones, ur: NEGATIVE mg/dL
Leukocytes,Ua: NEGATIVE
Nitrite: NEGATIVE
Protein, ur: NEGATIVE mg/dL
Specific Gravity, Urine: 1.005 (ref 1.005–1.030)
pH: 6 (ref 5.0–8.0)

## 2024-02-29 LAB — PREGNANCY, URINE: Preg Test, Ur: NEGATIVE

## 2024-02-29 MED ORDER — TETANUS-DIPHTH-ACELL PERTUSSIS 5-2-15.5 LF-MCG/0.5 IM SUSP
0.5000 mL | Freq: Once | INTRAMUSCULAR | Status: AC
  Administered 2024-02-29: 0.5 mL via INTRAMUSCULAR
  Filled 2024-02-29: qty 0.5

## 2024-02-29 MED ORDER — OXYCODONE-ACETAMINOPHEN 5-325 MG PO TABS
1.0000 | ORAL_TABLET | Freq: Once | ORAL | Status: AC
  Administered 2024-02-29: 1 via ORAL
  Filled 2024-02-29: qty 1
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
## 2024-03-10 ENCOUNTER — Other Ambulatory Visit: Payer: Self-pay

## 2024-03-10 ENCOUNTER — Inpatient Hospital Stay (HOSPITAL_COMMUNITY)
Admission: EM | Admit: 2024-03-10 | Discharge: 2024-03-14 | Disposition: A | Discharge status: Home / Self Care | Attending: Internal Medicine | Admitting: Internal Medicine

## 2024-03-10 ENCOUNTER — Encounter (HOSPITAL_COMMUNITY): Payer: Self-pay | Admitting: Emergency Medicine

## 2024-03-10 ENCOUNTER — Emergency Department (HOSPITAL_COMMUNITY)

## 2024-03-10 DIAGNOSIS — F17200 Nicotine dependence, unspecified, uncomplicated: Secondary | ICD-10-CM | POA: Diagnosis not present

## 2024-03-10 DIAGNOSIS — R5381 Other malaise: Secondary | ICD-10-CM | POA: Diagnosis present

## 2024-03-10 DIAGNOSIS — F1010 Alcohol abuse, uncomplicated: Secondary | ICD-10-CM | POA: Diagnosis present

## 2024-03-10 DIAGNOSIS — L02419 Cutaneous abscess of limb, unspecified: Secondary | ICD-10-CM | POA: Diagnosis not present

## 2024-03-10 DIAGNOSIS — N3000 Acute cystitis without hematuria: Secondary | ICD-10-CM | POA: Diagnosis not present

## 2024-03-10 DIAGNOSIS — L02415 Cutaneous abscess of right lower limb: Secondary | ICD-10-CM | POA: Diagnosis not present

## 2024-03-10 DIAGNOSIS — L03119 Cellulitis of unspecified part of limb: Secondary | ICD-10-CM | POA: Diagnosis present

## 2024-03-10 DIAGNOSIS — L0390 Cellulitis, unspecified: Secondary | ICD-10-CM | POA: Diagnosis present

## 2024-03-10 DIAGNOSIS — W19XXXA Unspecified fall, initial encounter: Secondary | ICD-10-CM | POA: Diagnosis present

## 2024-03-10 DIAGNOSIS — Z886 Allergy status to analgesic agent status: Secondary | ICD-10-CM

## 2024-03-10 DIAGNOSIS — L03115 Cellulitis of right lower limb: Secondary | ICD-10-CM | POA: Diagnosis present

## 2024-03-10 DIAGNOSIS — G4730 Sleep apnea, unspecified: Secondary | ICD-10-CM | POA: Diagnosis present

## 2024-03-10 DIAGNOSIS — N390 Urinary tract infection, site not specified: Secondary | ICD-10-CM | POA: Diagnosis present

## 2024-03-10 DIAGNOSIS — Z87440 Personal history of urinary (tract) infections: Secondary | ICD-10-CM

## 2024-03-10 DIAGNOSIS — R6520 Severe sepsis without septic shock: Secondary | ICD-10-CM | POA: Diagnosis present

## 2024-03-10 DIAGNOSIS — Z825 Family history of asthma and other chronic lower respiratory diseases: Secondary | ICD-10-CM

## 2024-03-10 DIAGNOSIS — R519 Headache, unspecified: Secondary | ICD-10-CM | POA: Diagnosis present

## 2024-03-10 DIAGNOSIS — F17210 Nicotine dependence, cigarettes, uncomplicated: Secondary | ICD-10-CM | POA: Diagnosis present

## 2024-03-10 DIAGNOSIS — A419 Sepsis, unspecified organism: Secondary | ICD-10-CM | POA: Diagnosis present

## 2024-03-10 LAB — COMPREHENSIVE METABOLIC PANEL WITH GFR
ALT: 12 U/L (ref 0–44)
AST: 17 U/L (ref 15–41)
Albumin: 3.7 g/dL (ref 3.5–5.0)
Alkaline Phosphatase: 165 U/L — ABNORMAL HIGH (ref 38–126)
Anion gap: 14 (ref 5–15)
BUN: 12 mg/dL (ref 6–20)
CO2: 25 mmol/L (ref 22–32)
Calcium: 9.8 mg/dL (ref 8.9–10.3)
Chloride: 94 mmol/L — ABNORMAL LOW (ref 98–111)
Creatinine, Ser: 0.68 mg/dL (ref 0.44–1.00)
GFR, Estimated: >60 mL/min
Glucose, Bld: 84 mg/dL (ref 70–99)
Potassium: 3.8 mmol/L (ref 3.5–5.1)
Sodium: 133 mmol/L — ABNORMAL LOW (ref 135–145)
Total Bilirubin: 0.5 mg/dL (ref 0.0–1.2)
Total Protein: 8.7 g/dL — ABNORMAL HIGH (ref 6.5–8.1)

## 2024-03-10 LAB — URINALYSIS, W/ REFLEX TO CULTURE (INFECTION SUSPECTED)
Bilirubin Urine: NEGATIVE
Glucose, UA: NEGATIVE mg/dL
Ketones, ur: 5 mg/dL — AB
Nitrite: POSITIVE — AB
Protein, ur: 100 mg/dL — AB
Specific Gravity, Urine: 1.01 (ref 1.005–1.030)
pH: 6 (ref 5.0–8.0)

## 2024-03-10 LAB — CBC WITH DIFFERENTIAL/PLATELET
Abs Immature Granulocytes: 0.38 K/uL — ABNORMAL HIGH (ref 0.00–0.07)
Basophils Absolute: 0.2 K/uL — ABNORMAL HIGH (ref 0.0–0.1)
Basophils Relative: 0 %
Eosinophils Absolute: 0.1 K/uL (ref 0.0–0.5)
Eosinophils Relative: 0 %
HCT: 36.3 % (ref 36.0–46.0)
Hemoglobin: 12.5 g/dL (ref 12.0–15.0)
Immature Granulocytes: 1 %
Lymphocytes Relative: 3 %
Lymphs Abs: 1.3 K/uL (ref 0.7–4.0)
MCH: 33.7 pg (ref 26.0–34.0)
MCHC: 34.4 g/dL (ref 30.0–36.0)
MCV: 97.8 fL (ref 80.0–100.0)
Monocytes Absolute: 2.1 K/uL — ABNORMAL HIGH (ref 0.1–1.0)
Monocytes Relative: 6 %
Neutro Abs: 33.2 K/uL — ABNORMAL HIGH (ref 1.7–7.7)
Neutrophils Relative %: 90 %
Platelets: 313 K/uL (ref 150–400)
RBC: 3.71 MIL/uL — ABNORMAL LOW (ref 3.87–5.11)
RDW: 11.9 % (ref 11.5–15.5)
WBC: 37.2 K/uL — ABNORMAL HIGH (ref 4.0–10.5)
nRBC: 0 % (ref 0.0–0.2)

## 2024-03-10 LAB — HCG, SERUM, QUALITATIVE: Preg, Serum: NEGATIVE

## 2024-03-10 LAB — I-STAT CG4 LACTIC ACID, ED: Lactic Acid, Venous: 1.1 mmol/L (ref 0.5–1.9)

## 2024-03-10 LAB — PROTIME-INR
INR: 1.1 (ref 0.8–1.2)
Prothrombin Time: 14.4 s (ref 11.4–15.2)

## 2024-03-10 MED ORDER — ADULT MULTIVITAMIN W/MINERALS CH
1.0000 | ORAL_TABLET | Freq: Every day | ORAL | Status: DC
  Administered 2024-03-11 – 2024-03-14 (×4): 1 via ORAL
  Filled 2024-03-10 (×4): qty 1

## 2024-03-10 MED ORDER — LORAZEPAM 1 MG PO TABS: 1.0000 mg | ORAL_TABLET | ORAL | Status: DC | PRN

## 2024-03-10 MED ORDER — LACTATED RINGERS IV BOLUS (SEPSIS)
1000.0000 mL | Freq: Once | INTRAVENOUS | Status: AC
  Administered 2024-03-10: 1000 mL via INTRAVENOUS

## 2024-03-10 MED ORDER — LORAZEPAM 2 MG/ML IJ SOLN: 1.0000 mg | INTRAMUSCULAR | Status: DC | PRN

## 2024-03-10 MED ORDER — THIAMINE MONONITRATE 100 MG PO TABS
100.0000 mg | ORAL_TABLET | Freq: Every day | ORAL | Status: DC
  Administered 2024-03-11 – 2024-03-14 (×4): 100 mg via ORAL
  Filled 2024-03-10 (×4): qty 1

## 2024-03-10 MED ORDER — THIAMINE HCL 100 MG/ML IJ SOLN
100.0000 mg | Freq: Every day | INTRAMUSCULAR | Status: DC
  Filled 2024-03-10 (×2): qty 2

## 2024-03-10 MED ORDER — SODIUM CHLORIDE 0.9 % IV SOLN
2.0000 g | Freq: Three times a day (TID) | INTRAVENOUS | Status: DC
  Administered 2024-03-11 – 2024-03-14 (×10): 2 g via INTRAVENOUS
  Filled 2024-03-10 (×10): qty 12.5

## 2024-03-10 MED ORDER — ORAL CARE MOUTH RINSE: 15.0000 mL | OROMUCOSAL | Status: DC | PRN

## 2024-03-10 MED ORDER — SODIUM CHLORIDE 0.9 % IV SOLN
2.0000 g | Freq: Once | INTRAVENOUS | Status: AC
  Administered 2024-03-10: 2 g via INTRAVENOUS
  Filled 2024-03-10: qty 20

## 2024-03-10 MED ORDER — CHLORHEXIDINE GLUCONATE CLOTH 2 % EX PADS
6.0000 | MEDICATED_PAD | Freq: Every day | CUTANEOUS | Status: DC
  Administered 2024-03-11 – 2024-03-13 (×3): 6 via TOPICAL

## 2024-03-10 MED ORDER — ACETAMINOPHEN 325 MG PO TABS
650.0000 mg | ORAL_TABLET | Freq: Once | ORAL | Status: AC | PRN
  Administered 2024-03-10: 650 mg via ORAL
  Filled 2024-03-10: qty 2

## 2024-03-10 MED ORDER — VANCOMYCIN HCL IN DEXTROSE 1-5 GM/200ML-% IV SOLN
1000.0000 mg | Freq: Once | INTRAVENOUS | Status: AC
  Administered 2024-03-10: 1000 mg via INTRAVENOUS
  Filled 2024-03-10: qty 200

## 2024-03-10 MED ORDER — FOLIC ACID 1 MG PO TABS
1.0000 mg | ORAL_TABLET | Freq: Every day | ORAL | Status: DC
  Administered 2024-03-11 – 2024-03-14 (×4): 1 mg via ORAL
  Filled 2024-03-10 (×4): qty 1

## 2024-03-10 MED ORDER — LACTATED RINGERS IV SOLN: INTRAVENOUS | Status: DC

## 2024-03-10 MED ORDER — VANCOMYCIN HCL 750 MG/150ML IV SOLN
750.0000 mg | Freq: Three times a day (TID) | INTRAVENOUS | Status: DC
  Administered 2024-03-11: 1750 mg via INTRAVENOUS
  Administered 2024-03-11 – 2024-03-14 (×8): 750 mg via INTRAVENOUS
  Filled 2024-03-10 (×14): qty 150

## 2024-03-10 NOTE — ED Notes (Signed)
 Patient transported to CT at this time.

## 2024-03-10 NOTE — ED Provider Notes (Signed)
 " [facility] AT [facility] Provider Note   CSN: [id number] Arrival date & time: 03/10/24  1530     Patient presents with: Knee Pain   Kristine Erickson is a 28 y.o. female.   Patient is a 62 year old who presents with pain and swelling to her right knee.  She had a fall on December 13.  At that time she had an abrasion noted to her right knee.  She has had over the last few days its become more red and swollen.  It is draining some purulent drainage from the prior wound.  She did not know that she was having fevers but she was noted to have a fever of 101.2 here in the ED.  She denies any other symptoms such as cough or cold symptoms.       Prior to Admission medications  Medication Sig Start Date End Date Taking? Authorizing Provider  ibuprofen  (ADVIL ) 200 MG tablet Take 200 mg by mouth every 6 (six) hours as needed.   Yes [provider]  benzonatate  (TESSALON  PERLES) 100 MG capsule Take 1 capsule (100 mg total) by mouth 3 (three) times daily as needed for cough. Patient not taking: Reported on 03/10/2024 10/28/23 10/27/24  Paseda, Folashade R, FNP  carbamide peroxide (DEBROX) 6.5 % OTIC solution Place 5 drops into both ears 2 (two) times daily. Patient not taking: Reported on 03/10/2024 10/28/23   Paseda, Folashade R, FNP    Allergies: Ibuprofen  and No known allergies    Review of Systems  Constitutional:  Positive for fever.  HENT:  Negative for congestion.   Respiratory:  Negative for cough, chest tightness and shortness of breath.   Gastrointestinal:  Negative for nausea and vomiting.  Genitourinary: Negative.   Musculoskeletal:  Positive for arthralgias and joint swelling. Negative for back pain and neck pain.  Skin:  Negative for wound.  Neurological:  Negative for weakness, numbness and headaches.    Updated Vital Signs BP 109/69   Pulse (!) 123   Temp 98.5 F (36.9 C) (Oral)   Resp 18   LMP 03/03/2024 (Approximate)    SpO2 98%   Physical Exam Constitutional:      Appearance: She is well-developed.  HENT:     Head: Normocephalic and atraumatic.  Eyes:     Pupils: Pupils are equal, round, and reactive to light.  Cardiovascular:     Rate and Rhythm: Regular rhythm. Tachycardia present.     Heart sounds: Normal heart sounds.  Pulmonary:     Effort: Pulmonary effort is normal. No respiratory distress.     Breath sounds: Normal breath sounds. No wheezing or rales.  Chest:     Chest wall: No tenderness.  Abdominal:     General: Bowel sounds are normal.     Palpations: Abdomen is soft.     Tenderness: There is no abdominal tenderness. There is no guarding or rebound.  Musculoskeletal:        General: Normal range of motion.     Cervical back: Normal range of motion and neck supple.     Comments: Patient has not opened wound over her right patella.  It is draining some yellow purulent drainage.  There is visible fascia.  She has swelling over the anterior knee.  She has redness and warmth extending proximally to the mid thigh and distally to the mid lower leg.  Pedal pulses intact.  She has some mild generalized swelling to the knee but no obvious  significant effusion.  Her tenderness is mostly over the anterior and medial portion of the knee along the track of the erythema.  Lymphadenopathy:     Cervical: No cervical adenopathy.  Skin:    General: Skin is warm and dry.     Findings: No rash.  Neurological:     Mental Status: She is alert and oriented to person, place, and time.     (all labs ordered are listed, but only abnormal results are displayed) Labs Reviewed  COMPREHENSIVE METABOLIC PANEL WITH GFR - Abnormal; Notable for the following components:      Result Value   Sodium 133 (*)    Chloride 94 (*)    Total Protein 8.7 (*)    Alkaline Phosphatase 165 (*)    All other components within normal limits  URINALYSIS, W/ REFLEX TO CULTURE (INFECTION SUSPECTED) - Abnormal; Notable for the  following components:   APPearance HAZY (*)    Hgb urine dipstick MODERATE (*)    Ketones, ur 5 (*)    Protein, ur 100 (*)    Nitrite POSITIVE (*)    Leukocytes,Ua SMALL (*)    Bacteria, UA MANY (*)    All other components within normal limits  CBC WITH DIFFERENTIAL/PLATELET - Abnormal; Notable for the following components:   WBC 37.2 (*)    RBC 3.71 (*)    Neutro Abs 33.2 (*)    Monocytes Absolute 2.1 (*)    Basophils Absolute 0.2 (*)    Abs Immature Granulocytes 0.38 (*)    All other components within normal limits  CULTURE, BLOOD (ROUTINE X 2)  CULTURE, BLOOD (ROUTINE X 2)  URINE CULTURE  MRSA NEXT GEN BY PCR, NASAL  PROTIME-INR  HCG, SERUM, QUALITATIVE  CBC WITH DIFFERENTIAL/PLATELET  ETHANOL  I-STAT CG4 LACTIC ACID, ED    EKG: None  Radiology: CT Knee Right Wo Contrast Result Date: 03/10/2024 CLINICAL DATA:  Redness and swelling EXAM: CT OF THE RIGHT KNEE WITHOUT CONTRAST TECHNIQUE: Multidetector CT imaging of the right knee was performed according to the standard protocol. Multiplanar CT image reconstructions were also generated. RADIATION DOSE REDUCTION: This exam was performed according to the departmental dose-optimization program which includes automated exposure control, adjustment of the mA and/or kV according to patient size and/or use of iterative reconstruction technique. COMPARISON:  Radiograph 03/10/2024 FINDINGS: Bones/Joint/Cartilage No fracture or dislocation. Small suprapatellar knee effusion. Patent joint spaces. Ligaments Suboptimally assessed by CT. Muscles and Tendons No significant atrophy. No intramuscular fluid collections. Quadriceps tendon and patellar tendons grossly normal in position Soft tissues Considerable circum pharyngeal edema extending from the distal thigh to the included lower leg, with associated diffuse skin thickening. Fluid and edema are greatest anteriorly, involving the soft tissues of the knee and proximal lower leg. No soft tissue  emphysema. No definite focal fluid collection but limited assessment without contrast IMPRESSION: 1. Considerable circumferential soft tissue edema and diffuse fluid extending from the distal thigh to the included lower leg, with associated diffuse skin thickening. Fluid and edema are greatest anteriorly, involving the prepatellar and infrapatellar soft tissues and proximal lower leg. No soft tissue emphysema. No definite focal fluid collection but limited assessment without contrast. 2. Small suprapatellar knee effusion. No acute osseous abnormality. Electronically Signed   By: Luke Bun M.D.   On: 03/10/2024 20:38   DG Knee 2 Views Right Result Date: 03/10/2024 CLINICAL DATA:  Pain, redness and swelling after scree ping the. EXAM: RIGHT KNEE - 1-2 VIEW COMPARISON:  Radiograph 02/29/2024 FINDINGS:  No evidence of fracture, dislocation, or joint effusion. No evidence of arthropathy or other focal bone abnormality. No erosive or bony destructive change. Generalized soft tissue edema, confluent anteriorly. The punctate density in the medial soft tissues on prior is not seen. No soft tissue gas. IMPRESSION: 1. Generalized soft tissue edema, confluent anteriorly. No soft tissue gas. Previous punctate soft tissue density is not seen on the current exam 2. No acute osseous abnormality. Electronically Signed   By: Andrea Gasman M.D.   On: 03/10/2024 19:04     Procedures   Medications Ordered in the ED  lactated ringers  infusion ( Intravenous New Bag/Given 03/10/24 1718)  acetaminophen  (TYLENOL ) tablet 650 mg (650 mg Oral Given 03/10/24 1557)  lactated ringers  bolus 1,000 mL (0 mLs Intravenous Stopped 03/10/24 1746)  cefTRIAXone  (ROCEPHIN ) 2 g in sodium chloride  0.9 % 100 mL IVPB (0 g Intravenous Stopped 03/10/24 1709)  vancomycin  (VANCOCIN ) IVPB 1000 mg/200 mL premix (0 mg Intravenous Stopped 03/10/24 1741)                                    Medical Decision Making Amount and/or Complexity of  Data Reviewed Labs: ordered. Radiology: ordered.  Risk OTC drugs. Prescription drug management. Decision regarding hospitalization.   This patient presents to the ED for concern of knee pain, this involves an extensive number of treatment options, and is a complaint that carries with it a high risk of complications and morbidity.  I considered the following differential and admission for this acute, potentially life threatening condition.  The differential diagnosis includes wound infection, cellulitis, septic arthritis, retained foreign body COVID-19 positive test (U07.1, COVID-19) with Viral Sepsis (A41.89 other specified sepsis) (If respiratory failure present, add as separate assessment)    MDM:    Patient is a 28 year old who presents with pain and swelling to her right knee.  She has some purulent drainage from the wound.  She is febrile and tachycardic on arrival.  She meets septic criteria.  IV fluids and antibiotics are ordered.  White count is markedly elevated at 37,000.  Initially discussed with Dr. Beuford.  I was concerned patient may need a washout of the area.  X-rays were obtained which did not show any bony involvement or evident foreign bodies.  He recommends starting patient on IV antibiotics which was done and he will consult on the patient.  Recommend hospitalist admission.  We did decide to go ahead and do a CT scan which showed a fluid pocket in the soft tissues extending from the distal femur area down to the lower leg.  I reconsult Dr. Beuford and let him know about this.  He recommends to keep patient n.p.o. after midnight and he will see the patient first thing in the morning.  Discussed with Dr. Silvester who will admit the patient.  CRITICAL CARE Performed by: Andrea Ness Total critical care time: 70 minutes Critical care time was exclusive of separately billable procedures and treating other patients. Critical care was necessary to treat or prevent imminent or  life-threatening deterioration. Critical care was time spent personally by me on the following activities: development of treatment plan with patient and/or surrogate as well as nursing, discussions with consultants, evaluation of patient's response to treatment, examination of patient, obtaining history from patient or surrogate, ordering and performing treatments and interventions, ordering and review of laboratory studies, ordering and review of radiographic studies, pulse oximetry and re-evaluation  of patient's condition.   (Labs, imaging, consults)  Labs: I Ordered, and personally interpreted labs.  The pertinent results include: Elevated WBC count, positive UTI  Imaging Studies ordered: I ordered imaging studies including x-ray right knee, CT right knee I independently visualized and interpreted imaging. I agree with the radiologist interpretation  Additional history obtained from family member at bedside.  External records from outside source obtained and reviewed including history  Cardiac Monitoring: The patient was maintained on a cardiac monitor.  If on the cardiac monitor, I personally viewed and interpreted the cardiac monitored which showed an underlying rhythm of: Sinus tachycardia  Reevaluation: After the interventions noted above, I reevaluated the patient and found that they have :improved  Social Determinants of Health:    Disposition: Admit to hospital  Co morbidities that complicate the patient evaluation  Past Medical History:  Diagnosis Date   Alcohol use disorder 09/16/2023   Anxiety    Chlamydia contact, treated    Depression    Marijuana use 09/16/2023   Sleep apnea    Tobacco use disorder 09/16/2023     Medicines Meds ordered this encounter  Medications   acetaminophen  (TYLENOL ) tablet 650 mg   lactated ringers  infusion   lactated ringers  bolus 1,000 mL    Reason 30 mL/kg dose is not being ordered:   Non-Septic Shock Clinical Presentation    cefTRIAXone  (ROCEPHIN ) 2 g in sodium chloride  0.9 % 100 mL IVPB    Antibiotic Indication::   Cellulitis   vancomycin  (VANCOCIN ) IVPB 1000 mg/200 mL premix    Indication::   Cellulitis    I have reviewed the patients home medicines and have made adjustments as needed  Problem List / ED Course: Problem List Items Addressed This Visit   None Visit Diagnoses       Cellulitis and abscess of leg    -  Primary     Sepsis without acute organ dysfunction, due to unspecified organism [facility])                    Final diagnoses:  Cellulitis and abscess of leg  Sepsis without acute organ dysfunction, due to unspecified organism [facility])    ED Discharge Orders     None          Lenor Hollering, MD 03/10/24 2306  "

## 2024-03-10 NOTE — Sepsis Progress Note (Signed)
 Elink will follow per sepsis protocol.

## 2024-03-10 NOTE — ED Triage Notes (Signed)
 Pt arrives w/ c/o redness, swelling & pain to rt knee. Reports she scarped her knee on concrete and reports since has had swelling, redness & pain in that area.

## 2024-03-10 NOTE — Progress Notes (Signed)
 Pharmacy Antibiotic Note  Kristine Erickson is a 28 y.o. female admitted on 03/10/2024 with pain and swelling to her right knee .  Pharmacy has been consulted for vancomycin  dosing, 1st dose given in the ED  Plan: Vancomycin  750mg  IV q8h (AUC 481.7, Scr  used 0.8) Follow renal function and clinical course  Height: 5' 3 (160 cm) Weight: 56.7 kg (125 lb) IBW/kg (Calculated) : 52.4  Temp (24hrs), Avg:99.8 F (37.7 C), Min:98.5 F (36.9 C), Max:101.2 F (38.4 C)  Recent Labs  Lab 03/10/24 1627 03/10/24 1634 03/10/24 1655  WBC  --   --  37.2*  CREATININE 0.68  --   --   LATICACIDVEN  --  1.1  --     Estimated Creatinine Clearance: 86.6 mL/min (by C-G formula based on SCr of 0.68 mg/dL).    Allergies[1]  Antimicrobials this admission: 12/23 vanc >> 12/23 CTX x 1 12/24 cefepime  >>  Dose adjustments this admission:   Microbiology results: 12/23 BCx:  12/23 UCx:  12/23 MRSA PCR:   Thank you for allowing pharmacy to be a part of this patients care.  Leeroy Mace RPh 03/10/2024, 11:18 PM     [1]  Allergies Allergen Reactions   Ibuprofen  Itching   No Known Allergies
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
## 2024-03-11 DIAGNOSIS — F17210 Nicotine dependence, cigarettes, uncomplicated: Secondary | ICD-10-CM

## 2024-03-11 DIAGNOSIS — L03115 Cellulitis of right lower limb: Secondary | ICD-10-CM

## 2024-03-11 DIAGNOSIS — F1010 Alcohol abuse, uncomplicated: Secondary | ICD-10-CM | POA: Diagnosis not present

## 2024-03-11 DIAGNOSIS — R6520 Severe sepsis without septic shock: Secondary | ICD-10-CM

## 2024-03-11 DIAGNOSIS — L02419 Cutaneous abscess of limb, unspecified: Secondary | ICD-10-CM

## 2024-03-11 DIAGNOSIS — L03119 Cellulitis of unspecified part of limb: Secondary | ICD-10-CM | POA: Diagnosis not present

## 2024-03-11 DIAGNOSIS — L0390 Cellulitis, unspecified: Secondary | ICD-10-CM | POA: Diagnosis present

## 2024-03-11 DIAGNOSIS — A419 Sepsis, unspecified organism: Secondary | ICD-10-CM

## 2024-03-11 DIAGNOSIS — N3000 Acute cystitis without hematuria: Secondary | ICD-10-CM | POA: Diagnosis not present

## 2024-03-11 DIAGNOSIS — L02415 Cutaneous abscess of right lower limb: Secondary | ICD-10-CM

## 2024-03-11 DIAGNOSIS — N390 Urinary tract infection, site not specified: Secondary | ICD-10-CM

## 2024-03-11 DIAGNOSIS — F17200 Nicotine dependence, unspecified, uncomplicated: Secondary | ICD-10-CM | POA: Diagnosis not present

## 2024-03-11 LAB — URINE CULTURE: Culture: NO GROWTH

## 2024-03-11 LAB — COMPREHENSIVE METABOLIC PANEL WITH GFR
ALT: 9 U/L (ref 0–44)
AST: 14 U/L — ABNORMAL LOW (ref 15–41)
Albumin: 3 g/dL — ABNORMAL LOW (ref 3.5–5.0)
Alkaline Phosphatase: 209 U/L — ABNORMAL HIGH (ref 38–126)
Anion gap: 11 (ref 5–15)
BUN: 7 mg/dL (ref 6–20)
CO2: 24 mmol/L (ref 22–32)
Calcium: 8.6 mg/dL — ABNORMAL LOW (ref 8.9–10.3)
Chloride: 99 mmol/L (ref 98–111)
Creatinine, Ser: 0.39 mg/dL — ABNORMAL LOW (ref 0.44–1.00)
GFR, Estimated: >60 mL/min
Glucose, Bld: 78 mg/dL (ref 70–99)
Potassium: 3.5 mmol/L (ref 3.5–5.1)
Sodium: 135 mmol/L (ref 135–145)
Total Bilirubin: 0.4 mg/dL (ref 0.0–1.2)
Total Protein: 6.4 g/dL — ABNORMAL LOW (ref 6.5–8.1)

## 2024-03-11 LAB — APTT: aPTT: 33 s (ref 24–36)

## 2024-03-11 LAB — PHOSPHORUS: Phosphorus: 3.1 mg/dL (ref 2.5–4.6)

## 2024-03-11 LAB — MRSA NEXT GEN BY PCR, NASAL: MRSA by PCR Next Gen: NOT DETECTED

## 2024-03-11 LAB — HIV ANTIBODY (ROUTINE TESTING W REFLEX): HIV Screen 4th Generation wRfx: NONREACTIVE

## 2024-03-11 LAB — HEMOGLOBIN A1C
Hgb A1c MFr Bld: 5.5 % (ref 4.8–5.6)
Mean Plasma Glucose: 111.15 mg/dL

## 2024-03-11 LAB — CBC
HCT: 31.6 % — ABNORMAL LOW (ref 36.0–46.0)
Hemoglobin: 10.7 g/dL — ABNORMAL LOW (ref 12.0–15.0)
MCH: 33.3 pg (ref 26.0–34.0)
MCHC: 33.9 g/dL (ref 30.0–36.0)
MCV: 98.4 fL (ref 80.0–100.0)
Platelets: 307 K/uL (ref 150–400)
RBC: 3.21 MIL/uL — ABNORMAL LOW (ref 3.87–5.11)
RDW: 12.2 % (ref 11.5–15.5)
WBC: 35.2 K/uL — ABNORMAL HIGH (ref 4.0–10.5)
nRBC: 0 % (ref 0.0–0.2)

## 2024-03-11 LAB — PREALBUMIN: Prealbumin: 7 mg/dL — ABNORMAL LOW (ref 18–38)

## 2024-03-11 LAB — C-REACTIVE PROTEIN: CRP: 31.7 mg/dL — ABNORMAL HIGH

## 2024-03-11 LAB — LACTIC ACID, PLASMA
Lactic Acid, Venous: 0.8 mmol/L (ref 0.5–1.9)
Lactic Acid, Venous: 0.8 mmol/L (ref 0.5–1.9)

## 2024-03-11 LAB — SEDIMENTATION RATE: Sed Rate: 60 mm/h — ABNORMAL HIGH (ref 0–22)

## 2024-03-11 LAB — MAGNESIUM: Magnesium: 2.1 mg/dL (ref 1.7–2.4)

## 2024-03-11 LAB — ETHANOL: Alcohol, Ethyl (B): <15 mg/dL

## 2024-03-11 MED ORDER — ACETAMINOPHEN 325 MG PO TABS
650.0000 mg | ORAL_TABLET | Freq: Four times a day (QID) | ORAL | Status: DC | PRN
  Administered 2024-03-11: 650 mg via ORAL
  Filled 2024-03-11: qty 2

## 2024-03-11 MED ORDER — ONDANSETRON HCL 4 MG PO TABS
4.0000 mg | ORAL_TABLET | Freq: Four times a day (QID) | ORAL | Status: DC | PRN
  Administered 2024-03-14: 4 mg via ORAL
  Filled 2024-03-11: qty 1

## 2024-03-11 MED ORDER — VITAMIN C 500 MG PO TABS
500.0000 mg | ORAL_TABLET | Freq: Two times a day (BID) | ORAL | Status: DC
  Administered 2024-03-11 – 2024-03-14 (×6): 500 mg via ORAL
  Filled 2024-03-11 (×6): qty 1

## 2024-03-11 MED ORDER — HYDROCODONE-ACETAMINOPHEN 5-325 MG PO TABS
1.0000 | ORAL_TABLET | ORAL | Status: DC | PRN
  Administered 2024-03-11: 2 via ORAL
  Administered 2024-03-11 (×2): 1 via ORAL
  Administered 2024-03-12 (×2): 2 via ORAL
  Administered 2024-03-12: 1 via ORAL
  Administered 2024-03-13 – 2024-03-14 (×6): 2 via ORAL
  Filled 2024-03-11 (×3): qty 2
  Filled 2024-03-11: qty 1
  Filled 2024-03-11 (×8): qty 2

## 2024-03-11 MED ORDER — NICOTINE 14 MG/24HR TD PT24
14.0000 mg | MEDICATED_PATCH | Freq: Every day | TRANSDERMAL | Status: DC
  Administered 2024-03-11 – 2024-03-14 (×4): 14 mg via TRANSDERMAL
  Filled 2024-03-11 (×4): qty 1

## 2024-03-11 MED ORDER — SODIUM CHLORIDE 0.9 % IV SOLN
150.0000 mL/h | INTRAVENOUS | Status: DC
  Administered 2024-03-11: 150 mL/h via INTRAVENOUS

## 2024-03-11 MED ORDER — ONDANSETRON HCL 4 MG/2ML IJ SOLN: 4.0000 mg | Freq: Four times a day (QID) | INTRAMUSCULAR | Status: DC | PRN

## 2024-03-11 MED ORDER — ACETAMINOPHEN 650 MG RE SUPP: 650.0000 mg | Freq: Four times a day (QID) | RECTAL | Status: DC | PRN

## 2024-03-11 MED ORDER — LACTATED RINGERS IV SOLN: INTRAVENOUS | Status: DC

## 2024-03-11 MED ORDER — ENSURE PLUS HIGH PROTEIN PO LIQD: 237.0000 mL | Freq: Two times a day (BID) | ORAL | Status: DC

## 2024-03-11 NOTE — Progress Notes (Signed)
 Initial Nutrition Assessment  DOCUMENTATION CODES:   Not applicable  INTERVENTION:  - Advance diet as per MD.  - Ensure Plus High Protein po BID, each supplement provides 350 kcal and 20 grams of protein. - Continue Multivitamin with minerals daily, folic acid , and thiamine  for history of alcohol abuse.  - Add 500mg  vitamin C  BID.  - Monitor weight trends.  NUTRITION DIAGNOSIS:   Increased nutrient needs related to wound healing as evidenced by estimated needs.  GOAL:   Patient will meet greater than or equal to 90% of their needs  MONITOR:   PO intake, Supplement acceptance, Weight trends, Skin  REASON FOR ASSESSMENT:   Consult Wound healing  ASSESSMENT:   28 y.o. female with PMH significant of alcohol abuse, tobacco abuse who presented with significant pain and swelling of the right knee. Admitted for severe sepsis and cellulitis and abscess of the leg.   RD working remotely. Called patient via bedside telephone to obtain nutrition history but unable to reach patient.   Per chart review, patient had a 10# or 7.6% weight loss from April to August. This is significant for a time frame of 4 months. Weight this admission is exactly the same as weight in August. Question if this weight was stated or copied over.   Patient is currently NPO. Patient would benefit from ONS for additional kcals and protein as well as wound healing vitamins.  Ortho saw the patient and does not feel there is an abscess or fluid collection. Recommending dressing changes every shift and continued IV antibiotics   Medications reviewed and include: 1mg  folic acid , MVI, 100mg  thiamine   Labs reviewed:  -  NUTRITION - FOCUSED PHYSICAL EXAM:  RD working remotely - deferred to follow-up  Diet Order:   Diet Order             Diet NPO time specified  Diet effective midnight                   EDUCATION NEEDS:  Not appropriate for education at this time  Skin:  Skin Assessment: Skin  Integrity Issues: Skin Integrity Issues:: Other (Comment) Other: infection of the right knee  Last BM:  PTA  Height: Ht Readings from Last 1 Encounters:  03/10/24 5' 3 (1.6 m)   Weight:  Wt Readings from Last 1 Encounters:  03/10/24 56.7 kg   BMI:  Body mass index is 22.14 kg/m.  Estimated Nutritional Needs:  Kcal:  1700-1900 kcals Protein:  75-85 grams Fluid:  >/= 1.7L    Trude Ned RD, LDN Contact via Secure Chat.

## 2024-03-11 NOTE — Assessment & Plan Note (Signed)
" -  SIRS criteria met with  elevated white blood cell count,       Component Value Date/Time   WBC 37.2 (H) 03/10/2024 1655   LYMPHSABS 1.3 03/10/2024 1655     tachycardia   ,   fever   RR >20 Today's Vitals   03/10/24 2130 03/10/24 2200 03/10/24 2300 03/11/24 0000  BP: 109/69 112/65 116/67 (!) 133/98  Pulse: (!) 123 (!) 122 (!) 120 (!) 126  Resp: 18 (!) 24 11 (!) 23  Temp:      TempSrc:      SpO2: 98% 99% 98% 97%  Weight:   56.7 kg   Height:   5' 3 (1.6 m)   PainSc:    8     -Most likely source being:  Cellulitis, soft tissue infection,       - Obtain serial lactic acid and procalcitonin level.  - Initiated IV antibiotics in ER: Antibiotics Given (last 72 hours)     Date/Time Action Medication Dose Rate   03/10/24 1639 New Bag/Given   cefTRIAXone  (ROCEPHIN ) 2 g in sodium chloride  0.9 % 100 mL IVPB 2 g 200 mL/hr   03/10/24 1641 New Bag/Given   vancomycin  (VANCOCIN ) IVPB 1000 mg/200 mL premix 1,000 mg 200 mL/hr   03/11/24 0011 New Bag/Given   vancomycin  (VANCOREADY) IVPB 750 mg/150 mL 1,750 mg 150 mL/hr       Will continue  on cefepime  and VanC   - await results of blood and urine culture  - Rehydrate aggressively  Intravenous fluids were administered,          30cc/kg fluid  12:43 AM  "

## 2024-03-11 NOTE — Assessment & Plan Note (Signed)
-   no concern for etoh w/d at this time - d/c CIWA

## 2024-03-11 NOTE — Assessment & Plan Note (Signed)
-   No urinary symptoms - UA showed small LE, positive nitrite, 11-20 WBC - likely no treatment needed, but on cefepime  for above regardless

## 2024-03-11 NOTE — Assessment & Plan Note (Signed)
-   Fever, tachycardia, tachypnea, leukocytosis.  Right knee cellulitis -See separate problem

## 2024-03-11 NOTE — Consult Note (Signed)
 Reason for Consult: Right knee pain  Kristine Erickson is an 28 y.o. female.  HPI:   Patient presented to the emergency department yesterday, approximately 10 days following an incident where her right knee contacted the ground.  She states that she had some pain since then, but over the last 3 days, her pain began to worsen.  She noticed drainage from her right knee a couple days ago.  She did present to the emergency department with severe right knee pain yesterday, and I was called to evaluate her.  She does feel that her pain has improved slightly since being admitted.  She has been getting IV antibiotics since her admission.  Past Medical History:  Diagnosis Date   Alcohol use disorder 09/16/2023   Anxiety    Chlamydia contact, treated    Depression    Marijuana use 09/16/2023   Sleep apnea    Tobacco use disorder 09/16/2023    Past Surgical History:  Procedure Laterality Date   CESAREAN SECTION N/A 03/13/2017   Procedure: CESAREAN SECTION;  Surgeon: Okey Leader, MD;  Location: [facility];  Service: Obstetrics;  Laterality: N/A;   NO PAST SURGERIES      Family History  Problem Relation Age of Onset   Asthma Mother     Social History:  reports that she has been smoking cigarettes. She has a 5 pack-year smoking history. She has never used smokeless tobacco. She reports current alcohol use. She reports current drug use. Drug: Marijuana.  Allergies: Allergies[1]  Medications: I have reviewed the patient's current medications.  Results for orders placed or performed during the hospital encounter of 03/10/24 (from the past 48 hours)  Comprehensive metabolic panel     Status: Abnormal   Collection Time: 03/10/24  4:27 PM  Result Value Ref Range   Sodium 133 (L) 135 - 145 mmol/L   Potassium 3.8 3.5 - 5.1 mmol/L   Chloride 94 (L) 98 - 111 mmol/L   CO2 25 22 - 32 mmol/L   Glucose, Bld 84 70 - 99 mg/dL    Comment: Glucose reference range applies only to samples taken  after fasting for at least 8 hours.   BUN 12 6 - 20 mg/dL   Creatinine, Ser 9.31 0.44 - 1.00 mg/dL   Calcium 9.8 8.9 - 89.6 mg/dL   Total Protein 8.7 (H) 6.5 - 8.1 g/dL   Albumin 3.7 3.5 - 5.0 g/dL   AST 17 15 - 41 U/L   ALT 12 0 - 44 U/L   Alkaline Phosphatase 165 (H) 38 - 126 U/L   Total Bilirubin 0.5 0.0 - 1.2 mg/dL   GFR, Estimated >39 >39 mL/min    Comment: (NOTE) Calculated using the CKD-EPI Creatinine Equation (2021)    Anion gap 14 5 - 15    Comment: Performed at [facility], [address], [place], [place] [address]  Protime-INR     Status: None   Collection Time: 03/10/24  4:27 PM  Result Value Ref Range   Prothrombin Time 14.4 11.4 - 15.2 seconds   INR 1.1 0.8 - 1.2    Comment: (NOTE) INR goal varies based on device and disease states. Performed at [facility], [address] [place], [place] [address]   hCG, serum, qualitative     Status: None   Collection Time: 03/10/24  4:27 PM  Result Value Ref Range   Preg, Serum NEGATIVE NEGATIVE    Comment:  THE SENSITIVITY OF THIS METHODOLOGY IS >10 mIU/mL. Performed at [facility], [address] [place], [place] [address]   I-Stat Lactic Acid, ED     Status: None   Collection Time: 03/10/24  4:34 PM  Result Value Ref Range   Lactic Acid, Venous 1.1 0.5 - 1.9 mmol/L  CBC with Differential/Platelet     Status: Abnormal   Collection Time: 03/10/24  4:55 PM  Result Value Ref Range   WBC 37.2 (H) 4.0 - 10.5 K/uL   RBC 3.71 (L) 3.87 - 5.11 MIL/uL   Hemoglobin 12.5 12.0 - 15.0 g/dL   HCT 63.6 63.9 - 53.9 %   MCV 97.8 80.0 - 100.0 fL   MCH 33.7 26.0 - 34.0 pg   MCHC 34.4 30.0 - 36.0 g/dL   RDW 88.0 88.4 - 84.4 %   Platelets 313 150 - 400 K/uL   nRBC 0.0 0.0 - 0.2 %   Neutrophils Relative % 90 %   Neutro Abs 33.2 (H) 1.7 - 7.7 K/uL   Lymphocytes Relative 3 %   Lymphs Abs 1.3 0.7 - 4.0 K/uL   Monocytes Relative 6 %   Monocytes Absolute 2.1 (H) 0.1  - 1.0 K/uL   Eosinophils Relative 0 %   Eosinophils Absolute 0.1 0.0 - 0.5 K/uL   Basophils Relative 0 %   Basophils Absolute 0.2 (H) 0.0 - 0.1 K/uL   WBC Morphology See Note     Comment: VACUOLATED NEUTROPHILS   Immature Granulocytes 1 %   Abs Immature Granulocytes 0.38 (H) 0.00 - 0.07 K/uL    Comment: Performed at [facility], [address] [place], [place] [address]  Urinalysis, w/ Reflex to Culture (Infection Suspected) -Urine, Clean Catch     Status: Abnormal   Collection Time: 03/10/24  6:27 PM  Result Value Ref Range   Specimen Source URINE, CATHETERIZED    Color, Urine YELLOW YELLOW   APPearance HAZY (A) CLEAR   Specific Gravity, Urine 1.010 1.005 - 1.030   pH 6.0 5.0 - 8.0   Glucose, UA NEGATIVE NEGATIVE mg/dL   Hgb urine dipstick MODERATE (A) NEGATIVE   Bilirubin Urine NEGATIVE NEGATIVE   Ketones, ur 5 (A) NEGATIVE mg/dL   Protein, ur 899 (A) NEGATIVE mg/dL   Nitrite POSITIVE (A) NEGATIVE   Leukocytes,Ua SMALL (A) NEGATIVE   RBC / HPF 0-5 0 - 5 RBC/hpf   WBC, UA 11-20 0 - 5 WBC/hpf    Comment:        Reflex urine culture not performed if WBC <=10, OR if Squamous epithelial cells >5. If Squamous epithelial cells >5 suggest recollection.    Bacteria, UA MANY (A) NONE SEEN   Squamous Epithelial / HPF 0-5 0 - 5 /HPF   Mucus PRESENT     Comment: Performed at [facility], [address], [place], [place] [address]  MRSA Next Gen by PCR, Nasal     Status: None   Collection Time: 03/11/24  1:13 AM   Specimen: Nasal Mucosa; Nasal Swab  Result Value Ref Range   MRSA by PCR Next Gen NOT DETECTED NOT DETECTED    Comment: (NOTE) The GeneXpert MRSA Assay (FDA approved for NASAL specimens only), is one component of a comprehensive MRSA colonization surveillance program. It is not intended to diagnose MRSA infection nor to guide or monitor treatment for MRSA infections. Test performance is not FDA approved in patients less than 93  years old. Performed at [facility], [address] [place], [place] [address]   Ethanol     Status: None   Collection Time: 03/11/24  3:25 AM  Result Value Ref Range   Alcohol, Ethyl (B) <15 <15 mg/dL    Comment: (NOTE) For medical purposes only. Performed at [facility], [address], [place], [place] [address]   Hemoglobin A1c     Status: None   Collection Time: 03/11/24  3:25 AM  Result Value Ref Range   Hgb A1c MFr Bld 5.5 4.8 - 5.6 %    Comment: (NOTE) Diagnosis of Diabetes The following HbA1c ranges recommended by the American Diabetes Association (ADA) may be used as an aid in the diagnosis of diabetes mellitus.  Hemoglobin             Suggested A1C NGSP%              Diagnosis  <5.7                   Non Diabetic  5.7-6.4                Pre-Diabetic  >6.4                   Diabetic  <7.0                   Glycemic control for                       adults with diabetes.     Mean Plasma Glucose 111.15 mg/dL    Comment: Performed at [facility], [address] [place], [place] [address]  Sedimentation rate     Status: Abnormal   Collection Time: 03/11/24  3:25 AM  Result Value Ref Range   Sed Rate 60 (H) 0 - 22 mm/hr    Comment: Performed at [facility], [address] [place], [place] [address]  APTT     Status: None   Collection Time: 03/11/24  3:25 AM  Result Value Ref Range   aPTT 33 24 - 36 seconds    Comment: Performed at [facility], [address], [place], [place] [address]  Lactic acid, plasma     Status: None   Collection Time: 03/11/24  3:25 AM  Result Value Ref Range   Lactic Acid, Venous 0.8 0.5 - 1.9 mmol/L    Comment: Performed at [facility], [address] [place], [place] [address]    CT Knee Right Wo Contrast Result Date: 03/10/2024 CLINICAL DATA:  Redness and swelling EXAM: CT OF THE RIGHT KNEE WITHOUT CONTRAST TECHNIQUE:  Multidetector CT imaging of the right knee was performed according to the standard protocol. Multiplanar CT image reconstructions were also generated. RADIATION DOSE REDUCTION: This exam was performed according to the departmental dose-optimization program which includes automated exposure control, adjustment of the mA and/or kV according to patient size and/or use of iterative reconstruction technique. COMPARISON:  Radiograph 03/10/2024 FINDINGS: Bones/Joint/Cartilage No fracture or dislocation. Small suprapatellar knee effusion. Patent joint spaces. Ligaments Suboptimally assessed by CT. Muscles and Tendons No significant atrophy. No intramuscular fluid collections. Quadriceps tendon and patellar tendons grossly normal in position Soft tissues Considerable circum pharyngeal edema extending from the distal thigh to the included lower leg, with associated diffuse skin thickening. Fluid and edema are greatest anteriorly, involving the soft tissues of the knee and proximal lower leg. No soft tissue  emphysema. No definite focal fluid collection but limited assessment without contrast IMPRESSION: 1. Considerable circumferential soft tissue edema and diffuse fluid extending from the distal thigh to the included lower leg, with associated diffuse skin thickening. Fluid and edema are greatest anteriorly, involving the prepatellar and infrapatellar soft tissues and proximal lower leg. No soft tissue emphysema. No definite focal fluid collection but limited assessment without contrast. 2. Small suprapatellar knee effusion. No acute osseous abnormality. Electronically Signed   By: Luke Bun M.D.   On: 03/10/2024 20:38   DG Knee 2 Views Right Result Date: 03/10/2024 CLINICAL DATA:  Pain, redness and swelling after scree ping the. EXAM: RIGHT KNEE - 1-2 VIEW COMPARISON:  Radiograph 02/29/2024 FINDINGS: No evidence of fracture, dislocation, or joint effusion. No evidence of arthropathy or other focal bone abnormality. No  erosive or bony destructive change. Generalized soft tissue edema, confluent anteriorly. The punctate density in the medial soft tissues on prior is not seen. No soft tissue gas. IMPRESSION: 1. Generalized soft tissue edema, confluent anteriorly. No soft tissue gas. Previous punctate soft tissue density is not seen on the current exam 2. No acute osseous abnormality. Electronically Signed   By: Andrea Gasman M.D.   On: 03/10/2024 19:04    Review of Systems Blood pressure (!) 133/98, pulse (!) 126, temperature 98.4 F (36.9 C), temperature source Oral, resp. rate (!) 23, height 5' 3 (1.6 m), weight 56.7 kg, last menstrual period 03/03/2024, SpO2 97%. Physical Exam Constitutional:      Appearance: Normal appearance.  HENT:     Head: Normocephalic.     Nose: Nose normal.  Pulmonary:     Effort: Pulmonary effort is normal.  Musculoskeletal:     Cervical back: Normal range of motion.  Skin:    Capillary Refill: Capillary refill takes less than 2 seconds.     Comments: Diffuse swelling noted about the right knee, with a 2mm open area of drainage over the region of the patella.  Purulent material is able to be expressed with firm circumferential pressure about the wound. No specific fluid abscess or collection noted.   Neurological:     General: No focal deficit present.     Mental Status: She is alert.     Assessment/Plan:  Patient noted to have right knee pain and swelling and drainage. Upon review of her CT, I see diffuse swelling but no discernable drainable fluid collection or abscess.   I do feel there is  purulence superficially, which currently is open and draining.   As there is no loculated fluid collection or abscess noted either clinically or on her CAT scan, I do recommend wet-to-dry dressing changes every shift.  I did speak with her nurse about this.  I do recommend continued IV antibiotics.   If the infection is not resolved with the plan above, an irrigation and debridement  may be considered.   Eppie Barhorst L Tayte Mcwherter 03/11/2024, 7:20 AM         [1]  Allergies Allergen Reactions   Ibuprofen  Itching   No Known Allergies

## 2024-03-11 NOTE — H&P (Signed)
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   Hemodynamic instability despite maximal medical therapy,  severe pain requiring acute inpatient management,  inability to maintain oral hydration    Need for operative/procedural  intervention    Need for IV antibiotics, IV fluids,, IV pain  medications,     Level of care     stepdown     Stanlee Roehrig 03/11/2024, 1:34 AM    [facility]     after 2 AM please page floor coverage   If 7AM-7PM, please contact the day team taking care of the patient using [email or web address]        [1]  Allergies Allergen Reactions   Ibuprofen  Itching   No Known Allergies    "
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
## 2024-03-11 NOTE — Assessment & Plan Note (Signed)
-  Spoke about importance of quitting spent 5 minutes discussing options for treatment, prior attempts at quitting, and dangers of smoking ? -At this point patient is    interested in quitting ? - order nicotine patch  ? - nursing tobacco cessation protocol ? ?

## 2024-03-11 NOTE — Assessment & Plan Note (Signed)
 Monitor for any sign of withdrawal order CIWA protocol
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
## 2024-03-11 NOTE — Assessment & Plan Note (Signed)
 Possible UTI versus colonization.  Should be covered with cefepime  await results of urine culture

## 2024-03-11 NOTE — Assessment & Plan Note (Signed)
 Continue nicotine patch

## 2024-03-11 NOTE — Assessment & Plan Note (Signed)
 Right lower extremity cellulitis secondary to an injury Continue cefepime  vancomycin  CT scan showing potential abscess Orthopedics consulted will see patient in the morning N.p.o. postmidnight continue broad-spectrum antibiotics rehydrate treat for sepsis pain management
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
## 2024-03-12 DIAGNOSIS — L03115 Cellulitis of right lower limb: Secondary | ICD-10-CM | POA: Diagnosis not present

## 2024-03-12 DIAGNOSIS — A419 Sepsis, unspecified organism: Secondary | ICD-10-CM | POA: Diagnosis not present

## 2024-03-12 LAB — CBC WITH DIFFERENTIAL/PLATELET
Abs Immature Granulocytes: 0.71 K/uL — ABNORMAL HIGH (ref 0.00–0.07)
Basophils Absolute: 0.1 K/uL (ref 0.0–0.1)
Basophils Relative: 0 %
Eosinophils Absolute: 0.2 K/uL (ref 0.0–0.5)
Eosinophils Relative: 1 %
HCT: 32.2 % — ABNORMAL LOW (ref 36.0–46.0)
Hemoglobin: 10.9 g/dL — ABNORMAL LOW (ref 12.0–15.0)
Immature Granulocytes: 3 %
Lymphocytes Relative: 8 %
Lymphs Abs: 2.3 K/uL (ref 0.7–4.0)
MCH: 33.2 pg (ref 26.0–34.0)
MCHC: 33.9 g/dL (ref 30.0–36.0)
MCV: 98.2 fL (ref 80.0–100.0)
Monocytes Absolute: 2.1 K/uL — ABNORMAL HIGH (ref 0.1–1.0)
Monocytes Relative: 8 %
Neutro Abs: 22.4 K/uL — ABNORMAL HIGH (ref 1.7–7.7)
Neutrophils Relative %: 80 %
Platelets: 352 K/uL (ref 150–400)
RBC: 3.28 MIL/uL — ABNORMAL LOW (ref 3.87–5.11)
RDW: 12.4 % (ref 11.5–15.5)
WBC: 27.8 K/uL — ABNORMAL HIGH (ref 4.0–10.5)
nRBC: 0 % (ref 0.0–0.2)

## 2024-03-12 LAB — COMPREHENSIVE METABOLIC PANEL WITH GFR
ALT: 26 U/L (ref 0–44)
AST: 28 U/L (ref 15–41)
Albumin: 3 g/dL — ABNORMAL LOW (ref 3.5–5.0)
Alkaline Phosphatase: 139 U/L — ABNORMAL HIGH (ref 38–126)
Anion gap: 10 (ref 5–15)
BUN: 5 mg/dL — ABNORMAL LOW (ref 6–20)
CO2: 27 mmol/L (ref 22–32)
Calcium: 8.8 mg/dL — ABNORMAL LOW (ref 8.9–10.3)
Chloride: 99 mmol/L (ref 98–111)
Creatinine, Ser: 0.46 mg/dL (ref 0.44–1.00)
GFR, Estimated: >60 mL/min
Glucose, Bld: 84 mg/dL (ref 70–99)
Potassium: 3.4 mmol/L — ABNORMAL LOW (ref 3.5–5.1)
Sodium: 135 mmol/L (ref 135–145)
Total Bilirubin: 0.4 mg/dL (ref 0.0–1.2)
Total Protein: 6.7 g/dL (ref 6.5–8.1)

## 2024-03-12 LAB — MAGNESIUM: Magnesium: 2.3 mg/dL (ref 1.7–2.4)

## 2024-03-12 MED ORDER — DIPHENHYDRAMINE HCL 25 MG PO CAPS
25.0000 mg | ORAL_CAPSULE | Freq: Once | ORAL | Status: AC | PRN
  Administered 2024-03-12: 25 mg via ORAL
  Filled 2024-03-12: qty 1

## 2024-03-12 MED ORDER — POTASSIUM CHLORIDE CRYS ER 20 MEQ PO TBCR
40.0000 meq | EXTENDED_RELEASE_TABLET | Freq: Once | ORAL | Status: AC
  Administered 2024-03-12: 40 meq via ORAL
  Filled 2024-03-12: qty 2

## 2024-03-12 NOTE — Progress Notes (Signed)
 Patient states pain has been improving and that drainage has lessened. She is able to walk more comfortably. Will continue abx for now and continue to ensure ongoing improvements. She did state that dressing was last changed last night. I will contact nursing to ensure patient continues to get wet to dry dressing changes q shift. Will make NPO after midnight tonight in the unlikely event she needs surgery tomorrow.

## 2024-03-12 NOTE — Plan of Care (Signed)
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
## 2024-03-12 NOTE — Plan of Care (Signed)
  Problem: Clinical Measurements: Goal: Will remain free from infection Outcome: Not Progressing   

## 2024-03-12 NOTE — Progress Notes (Signed)
" °   03/12/24 1029  TOC Brief Assessment  Insurance and Status Reviewed  Patient has primary care physician Yes  Home environment has been reviewed home  Prior level of function: independent  Prior/Current Home Services No current home services  Social Drivers of Health Review SDOH reviewed no interventions necessary  Readmission risk has been reviewed Yes  Transition of care needs no transition of care needs at this time    "

## 2024-03-13 DIAGNOSIS — L03115 Cellulitis of right lower limb: Secondary | ICD-10-CM | POA: Diagnosis not present

## 2024-03-13 DIAGNOSIS — A419 Sepsis, unspecified organism: Secondary | ICD-10-CM | POA: Diagnosis not present

## 2024-03-13 LAB — CBC WITH DIFFERENTIAL/PLATELET
Abs Immature Granulocytes: 0.54 K/uL — ABNORMAL HIGH (ref 0.00–0.07)
Basophils Absolute: 0.1 K/uL (ref 0.0–0.1)
Basophils Relative: 0 %
Eosinophils Absolute: 0.2 K/uL (ref 0.0–0.5)
Eosinophils Relative: 1 %
HCT: 31.5 % — ABNORMAL LOW (ref 36.0–46.0)
Hemoglobin: 10.6 g/dL — ABNORMAL LOW (ref 12.0–15.0)
Immature Granulocytes: 3 %
Lymphocytes Relative: 12 %
Lymphs Abs: 2.3 K/uL (ref 0.7–4.0)
MCH: 33.2 pg (ref 26.0–34.0)
MCHC: 33.7 g/dL (ref 30.0–36.0)
MCV: 98.7 fL (ref 80.0–100.0)
Monocytes Absolute: 1.6 K/uL — ABNORMAL HIGH (ref 0.1–1.0)
Monocytes Relative: 8 %
Neutro Abs: 14.4 K/uL — ABNORMAL HIGH (ref 1.7–7.7)
Neutrophils Relative %: 76 %
Platelets: 395 K/uL (ref 150–400)
RBC: 3.19 MIL/uL — ABNORMAL LOW (ref 3.87–5.11)
RDW: 12.5 % (ref 11.5–15.5)
WBC: 19 K/uL — ABNORMAL HIGH (ref 4.0–10.5)
nRBC: 0 % (ref 0.0–0.2)

## 2024-03-13 LAB — BASIC METABOLIC PANEL WITH GFR
Anion gap: 9 (ref 5–15)
BUN: 6 mg/dL (ref 6–20)
CO2: 28 mmol/L (ref 22–32)
Calcium: 8.9 mg/dL (ref 8.9–10.3)
Chloride: 102 mmol/L (ref 98–111)
Creatinine, Ser: 0.57 mg/dL (ref 0.44–1.00)
GFR, Estimated: >60 mL/min
Glucose, Bld: 68 mg/dL — ABNORMAL LOW (ref 70–99)
Potassium: 3.6 mmol/L (ref 3.5–5.1)
Sodium: 139 mmol/L (ref 135–145)

## 2024-03-13 MED ORDER — SENNOSIDES-DOCUSATE SODIUM 8.6-50 MG PO TABS
1.0000 | ORAL_TABLET | Freq: Two times a day (BID) | ORAL | Status: DC
  Administered 2024-03-13 – 2024-03-14 (×3): 1 via ORAL
  Filled 2024-03-13 (×3): qty 1

## 2024-03-13 MED ORDER — POLYETHYLENE GLYCOL 3350 17 G PO PACK
17.0000 g | PACK | Freq: Every day | ORAL | Status: DC
  Administered 2024-03-13 – 2024-03-14 (×2): 17 g via ORAL
  Filled 2024-03-13 (×2): qty 1

## 2024-03-13 NOTE — Plan of Care (Signed)
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
## 2024-03-13 NOTE — Progress Notes (Signed)
 Patient seen and examined. Pain improving. Able to walk thought some continued discomfort with flexion of the knee.   On exam her wound on her right has no drainage with healthy bed of granulation tissue. Some surrounding erythema and induration. Able to range knee 5-80. No pain with micro motion  Leukocytosis improving. Down to 19.0 from 27.8 from 35.2. Afebrile overnight  Continue wet-to dry dressing changes q shift. Continue IV abx.  No plan for surgical intervention given continued improvement.

## 2024-03-14 ENCOUNTER — Other Ambulatory Visit (HOSPITAL_COMMUNITY): Payer: Self-pay

## 2024-03-14 DIAGNOSIS — L03115 Cellulitis of right lower limb: Secondary | ICD-10-CM | POA: Diagnosis not present

## 2024-03-14 DIAGNOSIS — A419 Sepsis, unspecified organism: Secondary | ICD-10-CM | POA: Diagnosis not present

## 2024-03-14 LAB — CBC WITH DIFFERENTIAL/PLATELET
Abs Immature Granulocytes: 0.65 K/uL — ABNORMAL HIGH (ref 0.00–0.07)
Basophils Absolute: 0.1 K/uL (ref 0.0–0.1)
Basophils Relative: 1 %
Eosinophils Absolute: 0.2 K/uL (ref 0.0–0.5)
Eosinophils Relative: 1 %
HCT: 32.5 % — ABNORMAL LOW (ref 36.0–46.0)
Hemoglobin: 11 g/dL — ABNORMAL LOW (ref 12.0–15.0)
Immature Granulocytes: 4 %
Lymphocytes Relative: 15 %
Lymphs Abs: 2.6 K/uL (ref 0.7–4.0)
MCH: 33.4 pg (ref 26.0–34.0)
MCHC: 33.8 g/dL (ref 30.0–36.0)
MCV: 98.8 fL (ref 80.0–100.0)
Monocytes Absolute: 1.5 K/uL — ABNORMAL HIGH (ref 0.1–1.0)
Monocytes Relative: 9 %
Neutro Abs: 12.2 K/uL — ABNORMAL HIGH (ref 1.7–7.7)
Neutrophils Relative %: 70 %
Platelets: 444 K/uL — ABNORMAL HIGH (ref 150–400)
RBC: 3.29 MIL/uL — ABNORMAL LOW (ref 3.87–5.11)
RDW: 12.6 % (ref 11.5–15.5)
WBC: 17.1 K/uL — ABNORMAL HIGH (ref 4.0–10.5)
nRBC: 0 % (ref 0.0–0.2)

## 2024-03-14 MED ORDER — CEFUROXIME AXETIL 500 MG PO TABS
500.0000 mg | ORAL_TABLET | Freq: Two times a day (BID) | ORAL | Status: DC
  Administered 2024-03-14: 500 mg via ORAL
  Filled 2024-03-14: qty 1

## 2024-03-14 MED ORDER — HYDROCODONE-ACETAMINOPHEN 5-325 MG PO TABS
1.0000 | ORAL_TABLET | ORAL | 0 refills | Status: AC | PRN
  Filled 2024-03-14: qty 30, 5d supply, fill #0

## 2024-03-14 MED ORDER — SULFAMETHOXAZOLE-TRIMETHOPRIM 800-160 MG PO TABS
1.0000 | ORAL_TABLET | Freq: Two times a day (BID) | ORAL | Status: DC
  Administered 2024-03-14: 1 via ORAL
  Filled 2024-03-14: qty 1

## 2024-03-14 MED ORDER — SULFAMETHOXAZOLE-TRIMETHOPRIM 800-160 MG PO TABS
1.0000 | ORAL_TABLET | Freq: Two times a day (BID) | ORAL | 0 refills | Status: AC
  Filled 2024-03-14: qty 12, 6d supply, fill #0

## 2024-03-14 MED ORDER — CEFUROXIME AXETIL 500 MG PO TABS
500.0000 mg | ORAL_TABLET | Freq: Two times a day (BID) | ORAL | 0 refills | Status: AC
  Filled 2024-03-14: qty 12, 6d supply, fill #0

## 2024-03-14 NOTE — Plan of Care (Signed)

## 2024-03-14 NOTE — TOC Initial Note (Signed)
 Transition of Care [facility]) - Initial/Assessment Note    Patient Details  Name: Kristine Erickson MRN: [id number] Date of Birth: September 02, 1995  [facility] CM/SW Contact:    Sonda Manuella Quill, RN Phone Number: 03/14/2024, 11:14 AM  Clinical Narrative:                 IP CM for SA counseling/education; spoke w/ pt in room; pt said she lives at home w/ her children; she plans to return w/ family support at d/c; she will arrange transportation; pt identified POC, SO Loree Louder [phone]); pt verified insurance/PCP; she denied SDOH risks; pt does not have DME, HH services, or home oxygen; she declined resources for SA; no IP CM needs.  Expected Discharge Plan: Home/Self Care Barriers to Discharge: No Barriers Identified   Patient Goals and CMS Choice Patient states their goals for this hospitalization and ongoing recovery are:: home          Expected Discharge Plan and Services   Discharge Planning Services: CM Consult   Living arrangements for the past 2 months: Apartment Expected Discharge Date: 03/14/24               DME Arranged: N/A DME Agency: NA       HH Arranged: NA HH Agency: NA        Prior Living Arrangements/Services Living arrangements for the past 2 months: Apartment Lives with:: Minor Children Patient language and need for interpreter reviewed:: Yes Do you feel safe going back to the place where you live?: Yes      Need for Family Participation in Patient Care: Yes (Comment) Care giver support system in place?: Yes (comment) Current home services:  (n/a) Criminal Activity/Legal Involvement Pertinent to Current Situation/Hospitalization: No - Comment as needed  Activities of Daily Living   ADL Screening (condition at time of admission) Independently performs ADLs?: Yes (appropriate for developmental age) Is the patient deaf or have difficulty hearing?: No Does the patient have difficulty seeing, even when wearing glasses/contacts?:  No Does the patient have difficulty concentrating, remembering, or making decisions?: No  Permission Sought/Granted Permission sought to share information with : Case Manager Permission granted to share information with : Yes, Verbal Permission Granted  Share Information with NAME: Case Manager     Permission granted to share info w Relationship: Loree Louder (SO) [phone]     Emotional Assessment Appearance:: Appears stated age Attitude/Demeanor/Rapport: Gracious Affect (typically observed): Accepting Orientation: : Oriented to Self, Oriented to Place, Oriented to  Time, Oriented to Situation Alcohol / Substance Use: Not Applicable Psych Involvement: No (comment)  Admission diagnosis:  Cellulitis and abscess of leg [L03.119, L02.419] Severe sepsis (HCC) [A41.9, R65.20] Sepsis without acute organ dysfunction, due to unspecified organism [facility]) [A41.9] Patient Active Problem List   Diagnosis Date Noted   Cellulitis 03/11/2024   Annual physical exam 10/28/2023   Blurry vision, left eye 10/28/2023   Screening for cervical cancer 10/28/2023   Impacted cerumen, left ear 10/28/2023   Unemployment 10/28/2023   Elevated liver enzymes 10/28/2023   Chronic left shoulder pain 10/28/2023   Acute cough 10/28/2023   Anxiety and depression 09/16/2023   History of dislocation of shoulder 09/16/2023   Alcohol abuse 09/16/2023   Tobacco use disorder 09/16/2023   Marijuana use 09/16/2023   Acute cystitis with hematuria 09/16/2023   Labor and delivery, indication for care 03/13/2017   Cesarean delivery, delivered, current hospitalization 03/13/2017   Normal labor 04/16/2014   Status post normal vaginal  delivery 04/16/2014   [redacted] weeks gestation of pregnancy    Vaginal bleeding in pregnancy    PCP:  Paseda, Folashade R, FNP Pharmacy:   [facility] [phone] - [place] - [address] AT [facility] [address] [facility] [address] [place] [address] Phone:  [phone] Fax: [phone]     Social Drivers of Health (SDOH) Social History: SDOH Screenings   Food Insecurity: No Food Insecurity (03/14/2024)  Recent Concern: Food Insecurity - Food Insecurity Present (03/11/2024)  Housing: Low Risk (03/14/2024)  Recent Concern: Housing - High Risk (03/11/2024)  Transportation Needs: No Transportation Needs (03/14/2024)  Utilities: Not At Risk (03/14/2024)  Alcohol Screen: Medium Risk (09/12/2023)  Depression (PHQ2-9): Medium Risk (10/28/2023)  Financial Resource Strain: Medium Risk (09/12/2023)  Physical Activity: Sufficiently Active (09/12/2023)  Social Connections: Moderately Integrated (09/12/2023)  Stress: Stress Concern Present (09/12/2023)  Tobacco Use: High Risk (03/10/2024)   SDOH Interventions: Food Insecurity Interventions: Intervention Not Indicated, Inpatient TOC Housing Interventions: Intervention Not Indicated, Inpatient TOC Transportation Interventions: Intervention Not Indicated, Inpatient TOC Utilities Interventions: Intervention Not Indicated, Inpatient TOC   Readmission Risk Interventions     No data to display
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
## 2024-03-15 LAB — CULTURE, BLOOD (ROUTINE X 2)
Culture: NO GROWTH
Culture: NO GROWTH
Special Requests: ADEQUATE
Special Requests: ADEQUATE

## 2024-03-16 ENCOUNTER — Telehealth: Payer: Self-pay | Admitting: *Deleted

## 2024-03-16 NOTE — Transitions of Care (Post Inpatient/ED Visit) (Signed)
" ° °  03/16/2024  Name: Kristine Erickson MRN: [id number] DOB: 11-23-1995  Today's TOC FU Call Status: Today's TOC FU Call Status:: Unsuccessful Call (1st Attempt) Unsuccessful Call (1st Attempt) Date: 03/16/24  Attempted to reach the patient regarding the most recent Inpatient/ED visit.  Follow Up Plan: Additional outreach attempts will be made to reach the patient to complete the Transitions of Care (Post Inpatient/ED visit) call.   Cathlean Headland BSN RN [facility] Health Care Management Coordinator [email or web address] Direct Dial: [phone]  Fax: [phone] Website: [email or web address]  "

## 2024-03-18 ENCOUNTER — Telehealth: Payer: Self-pay

## 2024-03-18 NOTE — Transitions of Care (Post Inpatient/ED Visit) (Signed)
" ° °  03/18/2024  Name: Kristine Erickson MRN: [id number] DOB: 1995-11-18  Today's TOC FU Call Status: Today's TOC FU Call Status:: Unsuccessful Call (2nd Attempt) Unsuccessful Call (2nd Attempt) Date: 03/18/24  Attempted to reach the patient regarding the most recent Inpatient/ED visit.  Follow Up Plan: Additional outreach attempts will be made to reach the patient to complete the Transitions of Care (Post Inpatient/ED visit) call.   Arvin Seip RN, BSN, CCM [employer], Population Health Case Manager Phone: [phone]  "

## 2024-10-28 ENCOUNTER — Encounter: Admitting: Nurse Practitioner

## 2024-10-28 ENCOUNTER — Encounter: Payer: Self-pay | Admitting: Nurse Practitioner
